# Patient Record
Sex: Female | Born: 1995 | Race: Black or African American | Hispanic: No | Marital: Single | Smoking: Never smoker
Health system: Southern US, Community
[De-identification: ages and names within clinical notes are randomized; demographics above are authoritative.]

## PROBLEM LIST (undated history)

## (undated) DIAGNOSIS — T783XXA Angioneurotic edema, initial encounter: Secondary | ICD-10-CM

## (undated) DIAGNOSIS — L509 Urticaria, unspecified: Secondary | ICD-10-CM

## (undated) DIAGNOSIS — J45909 Unspecified asthma, uncomplicated: Secondary | ICD-10-CM

## (undated) DIAGNOSIS — L309 Dermatitis, unspecified: Secondary | ICD-10-CM

## (undated) HISTORY — DX: Angioneurotic edema, initial encounter: T78.3XXA

## (undated) HISTORY — DX: Dermatitis, unspecified: L30.9

## (undated) HISTORY — DX: Urticaria, unspecified: L50.9

## (undated) HISTORY — PX: SHOULDER SURGERY: SHX246

## (undated) HISTORY — DX: Unspecified asthma, uncomplicated: J45.909

---

## 2017-08-15 ENCOUNTER — Ambulatory Visit: Payer: 59 | Admitting: Allergy & Immunology

## 2017-08-15 ENCOUNTER — Encounter: Payer: Self-pay | Admitting: Allergy & Immunology

## 2017-08-15 VITALS — BP 110/80 | HR 80 | Temp 98.9°F | Resp 16 | Ht 65.25 in | Wt 253.0 lb

## 2017-08-15 DIAGNOSIS — J302 Other seasonal allergic rhinitis: Secondary | ICD-10-CM

## 2017-08-15 DIAGNOSIS — J3089 Other allergic rhinitis: Secondary | ICD-10-CM | POA: Diagnosis not present

## 2017-08-15 DIAGNOSIS — J4520 Mild intermittent asthma, uncomplicated: Secondary | ICD-10-CM

## 2017-08-15 DIAGNOSIS — T7800XD Anaphylactic reaction due to unspecified food, subsequent encounter: Secondary | ICD-10-CM | POA: Diagnosis not present

## 2017-08-15 MED ORDER — EPINEPHRINE 0.3 MG/0.3ML IJ SOAJ: INTRAMUSCULAR | 1 refills | Status: DC

## 2017-08-15 NOTE — Patient Instructions (Addendum)
1. Mild intermittent asthma, uncomplicated - Lung testing looked great today. - There is no need for a controller medication at this time.  - Daily controller medication(s): NONE - Prior to physical activity: ProAir 2 puffs 10-15 minutes before physical activity. - Rescue medications: ProAir 4 puffs every 4-6 hours as needed - Asthma control goals:  * Full participation in all desired activities (may need albuterol before activity) * Albuterol use two time or less a week on average (not counting use with activity) * Cough interfering with sleep two time or less a month * Oral steroids no more than once a year * No hospitalizations  2. Seasonal and perennial allergic rhinitis - Testing today showed: trees, weeds, grasses, cat, dog and cockroach - Avoidance measures provided. - Continue with: Claritin-D as needed, Zyrtec (cetirizine) 10mg  tablet once daily and Flonase (fluticasone) one spray per nostril daily - You can use an extra dose of the antihistamine, if needed, for breakthrough symptoms.  - Consider nasal saline rinses 1-2 times daily to remove allergens from the nasal cavities as well as help with mucous clearance (this is especially helpful to do before the nasal sprays are given) - We will continue with your current allergy shots and once you use these up, we will transition to ones that we make. - We will et some blood work to confirm that you have become desensitized to the molds.   3. Adverse food reaction - Testing was positive to: Milk, Shellfish Mix , Shrimp and Crab - Avoid the above foods for now.  - We will get blood work to confirm that the peanuts, tree nuts, and wheat were negative (we will call you in 1-2 weeks with the result) - Testing was negative to Peanut, Wheat, Casein, Cashew, Pecan, Walnut, [place], [place], [place] nut, [place]ut, [place], [place], [place] and [place] - Training for epinephrine auto-injectors provided: EpiPen - There is a the low positive  predictive value of food allergy testing and hence the high possibility of false positives. - In contrast, food allergy testing has a high negative predictive value, therefore if testing is negative we can be relatively assured that they are indeed negative.  - It is difficult to know how foods allergies will progress.  - In general, peanut, tree nut, and seafood allergies are life-long after age 21 or so.  4. Return in about 3 months (around 11/15/2017).   Please inform us of any Emergency Department visits, hospitalizations, or changes in symptoms. Call us before going to the ED for breathing or allergy symptoms since we might be able to fit you in for a sick visit. Feel free to contact us anytime with any questions, problems, or concerns.  It was a pleasure to meet you and your family today!  Websites that have reliable patient information: 1. American Academy of Asthma, Allergy, and Immunology: www.aaaai.org 2. Food Allergy Research and Education (FARE): foodallergy.org 3. Mothers of Asthmatics: http://www.asthmacommunitynetwork.org 4. American College of Allergy, Asthma, and Immunology: MissingWeapons.cawww.acaai.org   Make sure you are registered to vote!    Reducing Pollen Exposure  The American Academy of Allergy, Asthma and Immunology suggests the following steps to reduce your exposure to pollen during allergy seasons.    1. Do not hang sheets or clothing out to dry; pollen may collect on these items. 2. Do not mow lawns or spend time around freshly cut grass; mowing stirs up pollen. 3. Keep windows closed at night.  Keep car windows closed while driving. 4. Minimize morning activities  outdoors, a time when pollen counts are usually at their highest. 5. Stay indoors as much as possible when pollen counts or humidity is high and on windy days when pollen tends to remain in the air longer. 6. Use air conditioning when possible.  Many air conditioners have filters that trap the pollen  spores. 7. Use a HEPA room air filter to remove pollen form the indoor air you breathe.  Control of Dog or Cat Allergen  Avoidance is the best way to manage a dog or cat allergy. If you have a dog or cat and are allergic to dog or cats, consider removing the dog or cat from the home. If you have a dog or cat but don't want to find it a new home, or if your family wants a pet even though someone in the household is allergic, here are some strategies that may help keep symptoms at bay:  1. Keep the pet out of your bedroom and restrict it to only a few rooms. Be advised that keeping the dog or cat in only one room will not limit the allergens to that room. 2. Don't pet, hug or kiss the dog or cat; if you do, wash your hands with soap and water. 3. High-efficiency particulate air (HEPA) cleaners run continuously in a bedroom or living room can reduce allergen levels over time. 4. Regular use of a high-efficiency vacuum cleaner or a central vacuum can reduce allergen levels. 5. Giving your dog or cat a bath at least once a week can reduce airborne allergen.  Control of Cockroach Allergen  Cockroach allergen has been identified as an important cause of acute attacks of asthma, especially in urban settings.  There are fifty-five species of cockroach that exist in the [place], however only three, the [place], [place] species produce allergen that can affect patients with Asthma.  Allergens can be obtained from fecal particles, egg casings and secretions from cockroaches.    1. Remove food sources. 2. Reduce access to water. 3. Seal access and entry points. 4. Spray runways with 0.5-1% Diazinon or Chlorpyrifos 5. Blow boric acid power under stoves and refrigerator. 6. Place bait stations (hydramethylnon) at feeding sites.

## 2017-08-15 NOTE — Progress Notes (Signed)
NEW PATIENT  Date of Service/Encounter:  08/15/17  Referring provider: Patient, No Pcp Per   Assessment:   Mild intermittent asthma, uncomplicated  Seasonal and perennial allergic rhinitis (trees, weeds, grasses, cat, dog and cockroach)  Adverse food reaction (milk, shellfish)  Plan/Recommendations:   1. Mild intermittent asthma, uncomplicated - Lung testing looked great today. - There is no need for a controller medication at this time.  - Daily controller medication(s): NONE - Prior to physical activity: ProAir 2 puffs 10-15 minutes before physical activity. - Rescue medications: ProAir 4 puffs every 4-6 hours as needed - Asthma control goals:  * Full participation in all desired activities (may need albuterol before activity) * Albuterol use two time or less a week on average (not counting use with activity) * Cough interfering with sleep two time or less a month * Oral steroids no more than once a year * No hospitalizations  2. Seasonal and perennial allergic rhinitis - Testing today showed: trees, weeds, grasses, cat, dog and cockroach - Avoidance measures provided. - Continue with: Claritin-D as needed, Zyrtec (cetirizine) 10mg  tablet once daily and Flonase (fluticasone) one spray per nostril daily - You can use an extra dose of the antihistamine, if needed, for breakthrough symptoms.  - Consider nasal saline rinses 1-2 times daily to remove allergens from the nasal cavities as well as help with mucous clearance (this is especially helpful to do before the nasal sprays are given) - We will continue with your current allergy shots and once you use these up, we will transition to ones that we make. - We will get some blood work to confirm that you have become desensitized to the molds.   3. Adverse food reaction - Testing was positive to: Milk, Shellfish Mix , Shrimp and Crab - Avoid the above foods for now.  - We will get blood work to confirm that the peanuts, tree  nuts, and wheat were negative (we will call you in 1-2 weeks with the result) - Testing was negative to Peanut, Wheat, Casein, Cashew, Pecan, Walnut, [place], [place], [place] nut, [place], [place], [place], [place] and [place] - Training for epinephrine auto-injectors provided: EpiPen - There is a the low positive predictive value of food allergy testing and hence the high possibility of false positives. - In contrast, food allergy testing has a high negative predictive value, therefore if testing is negative we can be relatively assured that they are indeed negative.  - It is difficult to know how foods allergies will progress.  - In general, peanut, tree nut, and seafood allergies are life-long after age 17 or so.  4. Return in about 3 months (around 11/15/2017).  Subjective:   Tammie Yanda is a 22 y.o. female presenting today for evaluation of  Chief Complaint  Patient presents with  . Immunotherapy    transfering vials from another office  . New Patient (Initial Visit)    Charman Blasco has a history of the following: Patient Active Problem List   Diagnosis Date Noted  . Mild intermittent asthma, uncomplicated 08/17/2017  . Seasonal and perennial allergic rhinitis 08/17/2017  . 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  [name] was referred by Patient, No Pcp Per.     Gracielynn is a 22 y.o. female presenting for to transfer care.    Her family moved from [place].  They moved here in April 2019 and she is now establishing care full  time with us since her mother is living here in [place]. She was previously followed an allergist in [place].   She is a Holiday representativejunior at [employer]. Prior to this, she went to [employer]. She is majoring in Business with a minor in Communication. She is unsure what she is going to do with the degree. 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    Asthma/Respiratory Symptom History: This was diagnosed "day of birth". Mom was informed that she had breathing problems at that time. She has been on the machine (nebulizer) since day one. She has never been on an inhaled steroid. She was no premature. The ProAir expires before she uses it up.   Allergic Rhinitis Symptom History: She was diagnosed with allergies when she was age 22, but Mom thinks that she was suffering when she was 89 or 22 years old. She had nonstop rhinorrhea. Testing was done at age 22 because she would have breathing problems with ingestion of wheat and exposure to certain environments. Shots were started at age 22. She takes cetirizine on a daily basis she has Claritin-D to use if this does not completely clear it up. She goes get ear pressure and fluid and she has problems with sinus pain when the seasons change. She needs antibiotics when she gets sinus problems from summer through fall, around twice per year. She is currently receiving two injections: #1 contains trees, grasses, weeds, pollens and cat. #2 contains mixed mites and dogs.   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 She has tried Lactaid with similar problems. She can eat rice but cannot tolerate the rice milk. She does not eat coconut and cannot tolerate coconut milk. She does have wheezing with wheat exposure. She does not eats peanuts or tree nuts. She also avoids all shellfish. All of this was last tested when she was age 22. She does eat fin fish without a problem.   Otherwise, there is no history of other atopic diseases, including drug allergies, stinging insect allergies, or urticaria. There is no significant infectious history. Vaccinations are up to date.    Past Medical History: Patient Active Problem List   Diagnosis Date Noted  . Mild intermittent asthma, uncomplicated 08/17/2017  . Seasonal and perennial allergic rhinitis 08/17/2017  .  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 Always use your most recent med list.          cetirizine 10 MG tablet Commonly known as:  ZYRTEC Take 10 mg by mouth daily.   EPINEPHrine 0.3 mg/0.3 mL Soaj injection Commonly known as:  AUVI-Q Use as directed for severe allergic reaction   fluticasone 50 MCG/ACT nasal spray Commonly known as:  FLONASE Place into both nostrils daily.   loratadine-pseudoephedrine 10-240 MG 24 hr tablet Commonly known as:  CLARITIN-D 24-hour Take 1 tablet by mouth daily.   PROAIR HFA 108 (90 Base) MCG/ACT inhaler Generic drug:  albuterol Inhale into the lungs every 6 (six) hours as needed for wheezing or shortness  of breath.       Birth History: non-contributory. Born at term without complications.   Developmental History: non-contributory.   Past Surgical History: Past Surgical History:  Procedure Laterality Date  . SHOULDER SURGERY Left      Family History: Family History  Problem Relation Age of Onset  . Allergic rhinitis Mother   . Asthma Mother   . Food Allergy Mother        wheat corn milk yeast  . Allergic rhinitis Father   . Asthma Father   . Food Allergy Father        shellfish   . Eczema Father   . Eczema Brother   . Angioedema Neg Hx      Social History: Zyia lives at home with her mother, aunt, and grandmother. She has an older sister in [place] and an older brother who lives with her father. There are two younger siblings as well.  She lives in a house with wood in the main living areas and carpeting in the bedrooms. They have gas heating and central cooling. There are no animals inside or outside of  the home. There are dust mite coverings on the bedding. There is no smoking exposure. She is currently home for the summer.     Review of Systems: a 14-point review of systems is pertinent for what is mentioned in HPI.  Otherwise, all other systems were negative. Constitutional: negative other than that listed in the HPI Eyes: negative other than that listed in the HPI Ears, nose, mouth, throat, and face: negative other than that listed in the HPI Respiratory: negative other than that listed in the HPI Cardiovascular: negative other than that listed in the HPI Gastrointestinal: negative other than that listed in the HPI Genitourinary: negative other than that listed in the HPI Integument: negative other than that listed in the HPI Hematologic: negative other than that listed in the HPI Musculoskeletal: negative other than that listed in the HPI Neurological: negative other than that listed in the HPI Allergy/Immunologic: negative other than that listed in the HPI    Objective:   Blood pressure 110/80, pulse 80, temperature 98.9 F (37.2 C), temperature source Oral, resp. rate 16, height 5' 5.25" (1.657 m), weight 253 lb (114.8 kg). Body mass index is 41.78 kg/m.   Physical Exam:  General: Alert, interactive, in no acute distress. Pleasant female.  Eyes: No conjunctival injection bilaterally, no discharge on the right, no discharge on the left and no Horner-Trantas dots present. PERRL bilaterally. EOMI without pain. No photophobia.  Ears: Right TM pearly gray with normal light reflex, Left TM pearly gray with normal light reflex, Right TM intact without perforation and Left TM intact without perforation.  Nose/Throat: External nose within normal limits, nasal crease present and septum midline. Turbinates edematous and pale with clear discharge. Posterior oropharynx erythematous with cobblestoning in the posterior oropharynx. Tonsils 2+ without exudates.  Tongue without  thrush. Neck: Supple without thyromegaly. Trachea midline. Adenopathy: no enlarged lymph nodes appreciated in the anterior cervical, occipital, axillary, epitrochlear, inguinal, or popliteal regions. Lungs: Clear to auscultation without wheezing, rhonchi or rales. No increased work of breathing. CV: Normal S1/S2. No murmurs. Capillary refill <2 seconds.  Abdomen: Nondistended, nontender. No guarding or rebound tenderness. Bowel sounds present in all fields and hypoactive  Skin: Warm and dry, without lesions or rashes. Extremities:  No clubbing, cyanosis or edema. Neuro:   Grossly intact. No focal deficits appreciated. Responsive to questions.  Diagnostic studies:   Spirometry:  results normal (FEV1: 4.63/155%, FVC: 5.27/155%, FEV1/FVC: 88%).    Spirometry consistent with normal pattern.   Allergy Studies:  Indoor/Outdoor Percutaneous Adult Environmental Panel: positive to bahia grass, [place] grass, johnson grass, [place] blue grass, meadow fescue grass, perennial rye grass, sweet vernal grass, cocklebur, giant ragweed, English plantain, lamb's quarters, rough pigweed, rough marsh elder, ash, American beech, Box elder, red cedar, [address], elm, hickory, maple, pecan pollen, [place] sycamore, black walnut pollen, cat, dog and cockroach. Otherwise negative with adequate controls.  Selected Food Panel: positive to Milk (5x 7), Shellfish Mix (7 x 10), Shrimp (10x20) and Crab (10x15) with adequate controls. Negative to Peanut, Wheat, Casein, Cashew, Pecan, [place], [place], [place], [place] nut, [place], [place], [place], [place] and [place]   Allergy testing results were read and interpreted by myself, documented by clinical staff.       Malachi Bonds, MD [facility]

## 2017-08-17 ENCOUNTER — Encounter: Payer: Self-pay | Admitting: Allergy & Immunology

## 2017-08-17 DIAGNOSIS — J3089 Other allergic rhinitis: Secondary | ICD-10-CM

## 2017-08-17 DIAGNOSIS — J302 Other seasonal allergic rhinitis: Secondary | ICD-10-CM | POA: Insufficient documentation

## 2017-08-17 DIAGNOSIS — T7800XA Anaphylactic reaction due to unspecified food, initial encounter: Secondary | ICD-10-CM | POA: Insufficient documentation

## 2017-08-17 DIAGNOSIS — J4520 Mild intermittent asthma, uncomplicated: Secondary | ICD-10-CM | POA: Insufficient documentation

## 2017-08-17 NOTE — Progress Notes (Signed)
Added, per Rachelle.

## 2017-08-20 LAB — ALLERGY PANEL 18, NUT MIX GROUP
Allergen Coconut IgE: 0.82 kU/L — AB
F020-IGE ALMOND: 0.7 kU/L — AB
F202-IGE CASHEW NUT: 0.41 kU/L — AB
HAZELNUT (FILBERT) IGE: 1.92 kU/L — AB
PEANUT IGE: 2.54 kU/L — AB
PECAN NUT IGE: 0.22 kU/L — AB
Sesame Seed IgE: 1.31 kU/L — AB

## 2017-08-20 LAB — IGE PEANUT COMPONENT PROFILE
F352-IgE Ara h 8: 0.86 kU/L — AB
F422-IgE Ara h 1: <0.1 kU/L
F423-IgE Ara h 2: 0.82 kU/L — AB
F424-IgE Ara h 3: <0.1 kU/L
F447-IGE ARA H 6: 0.72 kU/L — AB

## 2017-08-20 LAB — ALLERGEN, WHEAT, F4: WHEAT IGE: 0.79 kU/L — AB

## 2017-08-23 LAB — IGE+ALLERGENS ZONE 2(30)
Amer Sycamore IgE Qn: 10.5 kU/L — AB
Aspergillus Fumigatus IgE: <0.1 kU/L
Cladosporium Herbarum IgE: <0.1 kU/L
Cockroach, American IgE: 0.49 kU/L — AB
D001-IGE D PTERONYSSINUS: 1.73 kU/L — AB
D002-IGE D FARINAE: 1.76 kU/L — AB
Dog Dander IgE: 4.22 kU/L — AB
E001-IGE CAT DANDER: 3.4 kU/L — AB
Elm, American IgE: 10.7 kU/L — AB
G002-IGE BERMUDA GRASS: 9.27 kU/L — AB
G010-IGE JOHNSON GRASS: 4.06 kU/L — AB
G017-IGE BAHIA GRASS: 4.28 kU/L — AB
Hickory, White IgE: 4.56 kU/L — AB
IGE (IMMUNOGLOBULIN E), SERUM: 414 [IU]/mL (ref 6–495)
MUGWORT IGE QN: 3.22 kU/L — AB
Maple/Box Elder IgE: 11.5 kU/L — AB
Mucor Racemosus IgE: <0.1 kU/L
Nettle IgE: 1.73 kU/L — AB
Oak, White IgE: 6.48 kU/L — AB
Penicillium Chrysogen IgE: <0.1 kU/L
Pigweed, Rough IgE: 4.73 kU/L — AB
Plantain, English IgE: 9.36 kU/L — AB
Sheep Sorrel IgE Qn: 11.9 kU/L — AB
Sweet gum IgE RAST Ql: 5.85 kU/L — AB
T003-IGE COMMON SILVER BIRCH: 12.7 kU/L — AB
T006-IGE CEDAR, MOUNTAIN: 1.59 kU/L — AB
TIMOTHY IGE: 8 kU/L — AB
W001-IGE RAGWEED, SHORT: 3.8 kU/L — AB
WHITE MULBERRY IGE: 0.71 kU/L — AB

## 2017-08-23 LAB — SPECIMEN STATUS REPORT

## 2017-08-24 ENCOUNTER — Telehealth: Payer: Self-pay | Admitting: *Deleted

## 2017-08-24 NOTE — Telephone Encounter (Signed)
I called and spoke with Desiree Sullivan per Dr. Dellis AnesGallagher explaining what her test results were and if she would be interested in conducting a food challenge to one of the items that she appeared allergic to. She stated that she will call back closer to her appointment to schedule for a food challenge and will specify then which food she would like to specify first.

## 2017-08-24 NOTE — Progress Notes (Signed)
Labs returned and confirmed that she has lost her mold sensitizations. Therefore I put in the orders for her new vials. We are going to make her new vials now even though she still has some from her previous practice. We are unable to really ascertain what is in her current vials and since we are assuming her permanent management from the [place] practice, we truly need to know what we are giving her each week in clinic.   Malachi BondsJoel Tomoki Lucken, MD [facility]

## 2017-08-24 NOTE — Addendum Note (Signed)
Addended by: Alfonse SpruceGALLAGHER, Janessa Mickle LOUIS on: 08/24/2017 06:58 AM   Modules accepted: Orders

## 2017-09-21 ENCOUNTER — Ambulatory Visit (INDEPENDENT_AMBULATORY_CARE_PROVIDER_SITE_OTHER): Payer: 59 | Admitting: *Deleted

## 2017-09-21 DIAGNOSIS — J309 Allergic rhinitis, unspecified: Secondary | ICD-10-CM

## 2017-10-05 ENCOUNTER — Encounter: Payer: 59 | Admitting: Allergy & Immunology

## 2017-10-17 ENCOUNTER — Ambulatory Visit: Payer: 59 | Admitting: Allergy and Immunology

## 2017-10-17 ENCOUNTER — Encounter: Payer: Self-pay | Admitting: Allergy and Immunology

## 2017-10-17 VITALS — BP 118/80 | HR 82 | Resp 18

## 2017-10-17 DIAGNOSIS — J4520 Mild intermittent asthma, uncomplicated: Secondary | ICD-10-CM

## 2017-10-17 DIAGNOSIS — Z91018 Allergy to other foods: Secondary | ICD-10-CM

## 2017-10-17 NOTE — Patient Instructions (Addendum)
History of food allergy The patient was able to tolerate the open graded oral challenge today without adverse signs or symptoms. Therefore, she has the same risk of systemic reaction associated with the consumption of wheat as the general population. Foods containing wheat may be re-introduced into the diet, gradually at first with access to diphenhydramine and epinephrine auto-injectors.  Mild intermittent asthma, uncomplicated  Continue albuterol HFA, 1 to 2 inhalations every 6 hours if needed.

## 2017-10-17 NOTE — Assessment & Plan Note (Signed)
   Continue albuterol HFA, 1 to 2 inhalations every 6 hours if needed.

## 2017-10-17 NOTE — Progress Notes (Signed)
    Follow-up Note  RE: Tommi RumpsKayla Illingworth MRN: [id number] DOB: 03/05/1996 Date of Office Visit: 10/17/2017  Primary care provider: Patient, No Pcp Per Referring provider: No ref. provider found  History of present illness: Tommi RumpsKayla Cusic is a 22 y.o. female with mild intermittent asthma, allergic rhinitis, and history of food allergies presented today for open graded oral challenge to wheat.  She was previously seen in this clinic for her initial evaluation by Dr. Dellis AnesGallagher on August 15, 2017.  He has cleared her for an open graded oral challenge to wheat.  She believes that she may have had "slight shortness of breath" when consuming wheat when she was 89 or 22 years old.  Assessment and plan: History of food allergy The patient was able to tolerate the open graded oral challenge today without adverse signs or symptoms. Therefore, she has the same risk of systemic reaction associated with the consumption of wheat as the general population. Foods containing wheat may be re-introduced into the diet, gradually at first with access to diphenhydramine and epinephrine auto-injectors.  Mild intermittent asthma, uncomplicated  Continue albuterol HFA, 1 to 2 inhalations every 6 hours if needed.   Diagnostics: Open graded wheat oral challenge: The patient was able to tolerate the challenge today without adverse signs or symptoms. Vital signs were stable throughout the challenge and observation period.  Spirometry:  Normal ventilatory function.  Please see scanned spirometry results for details.    Physical examination: Blood pressure 118/80, pulse 82, resp. rate 18, SpO2 97 %.  General: Alert, interactive, in no acute distress. HEENT: TMs pearly gray, turbinates moderately edematous without discharge, post-pharynx unremarkable. Neck: Supple without lymphadenopathy. Lungs: Clear to auscultation without wheezing, rhonchi or rales. CV: Normal S1, S2 without murmurs. Skin: Warm and dry, without  lesions or rashes.  The following portions of the patient's history were reviewed and updated as appropriate: allergies, current medications, past family history, past medical history, past social history, past surgical history and problem list.  Allergies as of 10/17/2017      Reactions   Peanut-containing Drug Products Anaphylaxis   Shellfish Allergy Anaphylaxis   Dairy Aid [lactase]    pain   Wheat Bran Hives, Itching   Yeast-related Products Itching      Medication List        Accurate as of 10/17/17  1:00 PM. Always use your most recent med list.          cetirizine 10 MG tablet Commonly known as:  ZYRTEC Take 10 mg by mouth daily.   EPINEPHrine 0.3 mg/0.3 mL Soaj injection Commonly known as:  AUVI-Q Use as directed for severe allergic reaction   fluticasone 50 MCG/ACT nasal spray Commonly known as:  FLONASE Place into both nostrils daily.   loratadine-pseudoephedrine 10-240 MG 24 hr tablet Commonly known as:  CLARITIN-D 24-hour Take 1 tablet by mouth daily.   PROAIR HFA 108 (90 Base) MCG/ACT inhaler Generic drug:  albuterol Inhale into the lungs every 6 (six) hours as needed for wheezing or shortness of breath.       Allergies  Allergen Reactions  . Peanut-Containing Drug Products Anaphylaxis  . Shellfish Allergy Anaphylaxis  . Dairy Aid [Lactase]     pain  . Wheat Bran Hives and Itching  . Yeast-Related Products Itching    I appreciate the opportunity to take part in Santa NellaKayla's care. Please do not hesitate to contact me with questions.  Sincerely,   R. Jorene Guestarter Jasaun Carn, MD

## 2017-10-17 NOTE — Assessment & Plan Note (Signed)
The patient was able to tolerate the open graded oral challenge today without adverse signs or symptoms. Therefore, she has the same risk of systemic reaction associated with the consumption of wheat as the general population. Foods containing wheat may be re-introduced into the diet, gradually at first with access to diphenhydramine and epinephrine auto-injectors.

## 2017-10-18 ENCOUNTER — Ambulatory Visit: Payer: 59 | Admitting: Allergy and Immunology

## 2017-10-25 ENCOUNTER — Ambulatory Visit (INDEPENDENT_AMBULATORY_CARE_PROVIDER_SITE_OTHER): Payer: 59

## 2017-10-25 DIAGNOSIS — J309 Allergic rhinitis, unspecified: Secondary | ICD-10-CM

## 2017-11-08 ENCOUNTER — Telehealth: Payer: Self-pay | Admitting: *Deleted

## 2017-11-08 NOTE — Telephone Encounter (Signed)
Paperwork for patient to take vial to outside facility that Dr. Dellis AnesGallagher needs to sign was placed in his office

## 2017-11-08 NOTE — Telephone Encounter (Signed)
Patient is in school at [employer]. Left message at the student health center for their fax number and to see if we needed to fill out any paperwork.

## 2017-11-09 DIAGNOSIS — J301 Allergic rhinitis due to pollen: Secondary | ICD-10-CM | POA: Diagnosis not present

## 2017-11-09 NOTE — Progress Notes (Signed)
VIALS EXP 11-10-18 

## 2017-11-10 DIAGNOSIS — J3089 Other allergic rhinitis: Secondary | ICD-10-CM

## 2017-11-11 NOTE — Telephone Encounter (Signed)
Dr. Gallagher, please advise and thank you.  

## 2017-11-11 NOTE — Telephone Encounter (Signed)
You did not sign it, it's in your office you can sign it Wednesday.

## 2017-11-11 NOTE — Telephone Encounter (Signed)
I have no idea if I signed it...   Malachi BondsJoel Nathanial Arrighi, MD [facility]

## 2017-11-16 NOTE — Telephone Encounter (Signed)
Signed and given to Dixie Dials.  Malachi Bonds, MD [facility]

## 2017-11-18 ENCOUNTER — Ambulatory Visit (INDEPENDENT_AMBULATORY_CARE_PROVIDER_SITE_OTHER): Payer: 59

## 2017-11-18 ENCOUNTER — Other Ambulatory Visit: Payer: Self-pay | Admitting: Allergy and Immunology

## 2017-11-18 DIAGNOSIS — J309 Allergic rhinitis, unspecified: Secondary | ICD-10-CM | POA: Diagnosis not present

## 2017-11-18 NOTE — Progress Notes (Signed)
Immunotherapy   Patient Details  Name: Desiree Sullivan MRN: [id number] Date of Birth: 1995/10/05  11/18/2017  [name] here to pick up  blue vials(g-w-t-c-d & rw-cr) Following schedule: A  Frequency:1-2 times weekly Epi-Pen:Epi-Pen Available  Consent signed and patient instructions given. Patient waited 30 minutes post injection with no local or systemic reactions. Patient took vials, injection log and injection protocol with her. External office consent signed.    I spoke with injection nurse at [facility] and was advised that they would not do injections for someone that was not on maintenance. I explained this to patient and mother. [facility] and spoke with the injection nurse who then put [place] on phone with, [name], the NP there. I spoke with NP and she informed me of the same as well as they do not start someone that has not been on injections before. I explained to her that the patient has been on injections for a long time and was on maintenance with her old vials from previous allergist. I explained that those vials were now empty and she had to start anew with our vial set following our protocol. The NP told me that as long as we had an extremely detailed injection protocol for them to follow that the patient could receive injections there. Patient is in agreement with all protocols in place and will go for injections on Monday & Thursday of each week. She is aware to come to [place] once she is ready to start her next level of vials.    LINAE MURA 11/18/2017, 2:57 PM

## 2017-11-18 NOTE — Telephone Encounter (Signed)
Mom requesting a refill for albuterol for Saniya's nebulizer machine. [facility].

## 2017-11-18 NOTE — Telephone Encounter (Signed)
I have looked in patients chart and we have not send any neb solution for this patient and not of her AVS state anything about a neb. Dr. Dellis Anes please advise.

## 2017-11-21 MED ORDER — ALBUTEROL SULFATE (2.5 MG/3ML) 0.083% IN NEBU: 2.5000 mg | INHALATION_SOLUTION | RESPIRATORY_TRACT | 1 refills | Status: DC | PRN

## 2017-11-21 NOTE — Addendum Note (Signed)
Addended by: [name] on: 11/21/2017 08:54 AM   Modules accepted: Orders

## 2017-11-21 NOTE — Telephone Encounter (Signed)
I am fine with sending in a nebulizer solution for her.  Malachi Bonds, MD [facility]

## 2017-12-12 ENCOUNTER — Telehealth: Payer: Self-pay | Admitting: *Deleted

## 2017-12-12 NOTE — Telephone Encounter (Signed)
That is ok if she has not had problems with large local reactions or systemic reactions. Thanks.

## 2017-12-12 NOTE — Telephone Encounter (Signed)
Mother called wants to know if it is ok for patient to get 3 shots this week Mon-Wed-Fri due to her coming home next week for fall break. Mother states that this way patient can start her new vial next week when she is home this would help in her not having to pick her up and bring her back to our office to start new vial. Dr Nunzio Cobbs please advise

## 2017-12-13 NOTE — Telephone Encounter (Signed)
Spoke to mother advised of plan ok per [place] to proceed if no reactions in past. Mother verbalized understanding states she will bring in patient next week to start new vial

## 2017-12-14 NOTE — Telephone Encounter (Signed)
Nurse at [facility] called to verify schedule build up this week understands if reaction cannot proceed. Will send paperwork with patient

## 2017-12-19 ENCOUNTER — Ambulatory Visit (INDEPENDENT_AMBULATORY_CARE_PROVIDER_SITE_OTHER): Payer: 59

## 2017-12-19 DIAGNOSIS — J309 Allergic rhinitis, unspecified: Secondary | ICD-10-CM

## 2017-12-19 NOTE — Progress Notes (Signed)
Immunotherapy   Patient Details  Name: Desiree Sullivan MRN: [id number] Date of Birth: 11/22/1995  12/19/2017  [name] Came in today to pick up her Gold/Yellow vials G-W-T-C-D and RW-CR and receive her first injection out of them. Patient received 0.05 ml out of both vials and waited 30 minutes with no problems. Following schedule: A  Frequency: 1-2 times weekly Epi-Pen: yes Consent signed and patient instructions given. Patient took out vials to [facility] at [address] [place] [address].    [name] 12/19/2017, 10:51 AM

## 2018-02-06 ENCOUNTER — Ambulatory Visit: Payer: Self-pay

## 2018-02-13 ENCOUNTER — Ambulatory Visit: Payer: Self-pay

## 2018-02-14 ENCOUNTER — Telehealth: Payer: Self-pay

## 2018-02-14 NOTE — Telephone Encounter (Signed)
Nedra HaiNick Young RN from [employer] called asking about pt Xolair schedule A. He stated her last dose of 0.35cc was given on 02/06/18 and he wanted to know what her next dose would be if she got it today, I informed him should could continue to move on to her next dose of 0.4cc

## 2018-02-27 ENCOUNTER — Ambulatory Visit (INDEPENDENT_AMBULATORY_CARE_PROVIDER_SITE_OTHER): Payer: 59 | Admitting: *Deleted

## 2018-02-27 DIAGNOSIS — J309 Allergic rhinitis, unspecified: Secondary | ICD-10-CM

## 2018-03-03 ENCOUNTER — Ambulatory Visit (INDEPENDENT_AMBULATORY_CARE_PROVIDER_SITE_OTHER): Payer: 59 | Admitting: *Deleted

## 2018-03-03 DIAGNOSIS — J309 Allergic rhinitis, unspecified: Secondary | ICD-10-CM | POA: Diagnosis not present

## 2018-03-06 ENCOUNTER — Ambulatory Visit (INDEPENDENT_AMBULATORY_CARE_PROVIDER_SITE_OTHER): Payer: Self-pay | Admitting: *Deleted

## 2018-03-06 DIAGNOSIS — J309 Allergic rhinitis, unspecified: Secondary | ICD-10-CM

## 2018-03-10 ENCOUNTER — Ambulatory Visit (INDEPENDENT_AMBULATORY_CARE_PROVIDER_SITE_OTHER): Payer: Self-pay | Admitting: *Deleted

## 2018-03-10 DIAGNOSIS — J309 Allergic rhinitis, unspecified: Secondary | ICD-10-CM

## 2018-03-13 ENCOUNTER — Ambulatory Visit (INDEPENDENT_AMBULATORY_CARE_PROVIDER_SITE_OTHER): Payer: Self-pay | Admitting: *Deleted

## 2018-03-13 DIAGNOSIS — J309 Allergic rhinitis, unspecified: Secondary | ICD-10-CM

## 2018-03-17 ENCOUNTER — Ambulatory Visit (INDEPENDENT_AMBULATORY_CARE_PROVIDER_SITE_OTHER): Payer: Self-pay | Admitting: *Deleted

## 2018-03-17 DIAGNOSIS — J309 Allergic rhinitis, unspecified: Secondary | ICD-10-CM

## 2018-03-20 ENCOUNTER — Ambulatory Visit (INDEPENDENT_AMBULATORY_CARE_PROVIDER_SITE_OTHER): Payer: Self-pay | Admitting: *Deleted

## 2018-03-20 DIAGNOSIS — J309 Allergic rhinitis, unspecified: Secondary | ICD-10-CM

## 2018-03-20 NOTE — Progress Notes (Signed)
Immunotherapy   Patient Details  Name: Tyreona Kochersperger MRN: [id number] Date of Birth: 01/24/1996  03/20/2018  [name] Came in today to pick up her Green vials G-W-T-C-D and RW-CR and received 0.3 ml out of both vials and waited 30 minutes with no problems. Following schedule: A  Frequency: 1-2 times weekly Epi-Pen: yes Consent signed and patient instructions given. Patient took out vials to [facility] at [address] [place] [address].   Michel Harrow R 03/20/2018, 12:02 PM

## 2018-04-14 ENCOUNTER — Ambulatory Visit (INDEPENDENT_AMBULATORY_CARE_PROVIDER_SITE_OTHER): Payer: Self-pay | Admitting: *Deleted

## 2018-04-14 DIAGNOSIS — J309 Allergic rhinitis, unspecified: Secondary | ICD-10-CM

## 2018-04-14 NOTE — Progress Notes (Signed)
Immunotherapy   Patient Details  Name: Desiree Sullivan MRN: [id number] Date of Birth: 06/28/1995  04/14/2018  [name] started injections for  Grass-Weed-Tree-Cat-Dog & Ragweed-Cockroach. Following schedule: A  Frequency:1 time per week Epi-Pen:Epi-Pen Available  Consent signed and patient instructions given. Patient took vials out to [facility], [address], [place], [place] [address].   Mariane Duval 04/14/2018, 9:55 AM

## 2018-05-30 ENCOUNTER — Ambulatory Visit (INDEPENDENT_AMBULATORY_CARE_PROVIDER_SITE_OTHER): Payer: Self-pay | Admitting: *Deleted

## 2018-05-30 ENCOUNTER — Other Ambulatory Visit: Payer: Self-pay

## 2018-05-30 DIAGNOSIS — J309 Allergic rhinitis, unspecified: Secondary | ICD-10-CM

## 2018-06-06 ENCOUNTER — Ambulatory Visit (INDEPENDENT_AMBULATORY_CARE_PROVIDER_SITE_OTHER): Payer: Self-pay | Admitting: *Deleted

## 2018-06-06 DIAGNOSIS — J309 Allergic rhinitis, unspecified: Secondary | ICD-10-CM

## 2018-06-13 ENCOUNTER — Other Ambulatory Visit: Payer: Self-pay

## 2018-06-13 ENCOUNTER — Ambulatory Visit (INDEPENDENT_AMBULATORY_CARE_PROVIDER_SITE_OTHER): Payer: Self-pay

## 2018-06-13 DIAGNOSIS — J309 Allergic rhinitis, unspecified: Secondary | ICD-10-CM

## 2018-06-20 ENCOUNTER — Ambulatory Visit (INDEPENDENT_AMBULATORY_CARE_PROVIDER_SITE_OTHER): Payer: Self-pay

## 2018-06-20 ENCOUNTER — Other Ambulatory Visit: Payer: Self-pay

## 2018-06-20 DIAGNOSIS — J309 Allergic rhinitis, unspecified: Secondary | ICD-10-CM

## 2018-06-27 ENCOUNTER — Ambulatory Visit (INDEPENDENT_AMBULATORY_CARE_PROVIDER_SITE_OTHER): Payer: Self-pay

## 2018-06-27 ENCOUNTER — Other Ambulatory Visit: Payer: Self-pay

## 2018-06-27 DIAGNOSIS — J309 Allergic rhinitis, unspecified: Secondary | ICD-10-CM

## 2018-07-04 ENCOUNTER — Other Ambulatory Visit: Payer: Self-pay

## 2018-07-04 ENCOUNTER — Ambulatory Visit (INDEPENDENT_AMBULATORY_CARE_PROVIDER_SITE_OTHER): Payer: BLUE CROSS/BLUE SHIELD

## 2018-07-04 DIAGNOSIS — J309 Allergic rhinitis, unspecified: Secondary | ICD-10-CM | POA: Diagnosis not present

## 2018-07-11 ENCOUNTER — Other Ambulatory Visit: Payer: Self-pay

## 2018-07-11 ENCOUNTER — Ambulatory Visit (INDEPENDENT_AMBULATORY_CARE_PROVIDER_SITE_OTHER): Payer: BLUE CROSS/BLUE SHIELD

## 2018-07-11 DIAGNOSIS — J309 Allergic rhinitis, unspecified: Secondary | ICD-10-CM | POA: Diagnosis not present

## 2018-07-11 NOTE — Progress Notes (Signed)
VIALS EXP 07-11-2019 

## 2018-07-12 DIAGNOSIS — J301 Allergic rhinitis due to pollen: Secondary | ICD-10-CM | POA: Diagnosis not present

## 2018-07-18 ENCOUNTER — Ambulatory Visit (INDEPENDENT_AMBULATORY_CARE_PROVIDER_SITE_OTHER): Payer: BLUE CROSS/BLUE SHIELD

## 2018-07-18 ENCOUNTER — Other Ambulatory Visit: Payer: Self-pay

## 2018-07-18 DIAGNOSIS — J309 Allergic rhinitis, unspecified: Secondary | ICD-10-CM

## 2018-07-25 ENCOUNTER — Other Ambulatory Visit: Payer: Self-pay

## 2018-07-25 ENCOUNTER — Ambulatory Visit (INDEPENDENT_AMBULATORY_CARE_PROVIDER_SITE_OTHER): Payer: BLUE CROSS/BLUE SHIELD

## 2018-07-25 DIAGNOSIS — J309 Allergic rhinitis, unspecified: Secondary | ICD-10-CM | POA: Diagnosis not present

## 2018-08-01 ENCOUNTER — Other Ambulatory Visit: Payer: Self-pay

## 2018-08-01 ENCOUNTER — Ambulatory Visit (INDEPENDENT_AMBULATORY_CARE_PROVIDER_SITE_OTHER): Payer: BLUE CROSS/BLUE SHIELD

## 2018-08-01 DIAGNOSIS — J309 Allergic rhinitis, unspecified: Secondary | ICD-10-CM | POA: Diagnosis not present

## 2018-08-08 ENCOUNTER — Ambulatory Visit (INDEPENDENT_AMBULATORY_CARE_PROVIDER_SITE_OTHER): Payer: BLUE CROSS/BLUE SHIELD

## 2018-08-08 ENCOUNTER — Other Ambulatory Visit: Payer: Self-pay

## 2018-08-08 DIAGNOSIS — J309 Allergic rhinitis, unspecified: Secondary | ICD-10-CM | POA: Diagnosis not present

## 2018-08-22 ENCOUNTER — Other Ambulatory Visit: Payer: Self-pay

## 2018-08-22 ENCOUNTER — Ambulatory Visit (INDEPENDENT_AMBULATORY_CARE_PROVIDER_SITE_OTHER): Payer: BLUE CROSS/BLUE SHIELD

## 2018-08-22 ENCOUNTER — Telehealth: Payer: Self-pay | Admitting: Allergy and Immunology

## 2018-08-22 DIAGNOSIS — J309 Allergic rhinitis, unspecified: Secondary | ICD-10-CM | POA: Diagnosis not present

## 2018-08-22 MED ORDER — EPINEPHRINE 0.3 MG/0.3ML IJ SOAJ: INTRAMUSCULAR | 0 refills | Status: DC

## 2018-08-22 NOTE — Telephone Encounter (Signed)
Refill for Auvi Q sent in called patient but number states all circuits are busy at this time

## 2018-08-22 NOTE — Telephone Encounter (Signed)
Patient is a shot patient and is requesting a refill on her Epi Pen. Hers is expiring. [place]

## 2018-08-29 ENCOUNTER — Ambulatory Visit (INDEPENDENT_AMBULATORY_CARE_PROVIDER_SITE_OTHER): Payer: BLUE CROSS/BLUE SHIELD

## 2018-08-29 ENCOUNTER — Other Ambulatory Visit: Payer: Self-pay

## 2018-08-29 DIAGNOSIS — J309 Allergic rhinitis, unspecified: Secondary | ICD-10-CM

## 2018-09-05 ENCOUNTER — Other Ambulatory Visit: Payer: Self-pay

## 2018-09-05 ENCOUNTER — Ambulatory Visit (INDEPENDENT_AMBULATORY_CARE_PROVIDER_SITE_OTHER): Payer: BLUE CROSS/BLUE SHIELD

## 2018-09-05 DIAGNOSIS — J309 Allergic rhinitis, unspecified: Secondary | ICD-10-CM

## 2018-09-05 MED ORDER — EPINEPHRINE 0.3 MG/0.3ML IJ SOAJ: 0.3000 mg | INTRAMUSCULAR | 0 refills | Status: DC | PRN

## 2018-09-05 NOTE — Addendum Note (Signed)
Addended by: Herbie Drape on: 09/05/2018 09:15 AM   Modules accepted: Orders

## 2018-09-05 NOTE — Telephone Encounter (Signed)
Received faxed stating that an Epi pen was sent in instead of an Auvi-q. New rx sent in.

## 2018-09-12 ENCOUNTER — Ambulatory Visit (INDEPENDENT_AMBULATORY_CARE_PROVIDER_SITE_OTHER): Payer: BLUE CROSS/BLUE SHIELD

## 2018-09-12 ENCOUNTER — Other Ambulatory Visit: Payer: Self-pay

## 2018-09-12 DIAGNOSIS — J309 Allergic rhinitis, unspecified: Secondary | ICD-10-CM

## 2018-09-18 ENCOUNTER — Telehealth: Payer: Self-pay | Admitting: Allergy and Immunology

## 2018-09-18 ENCOUNTER — Other Ambulatory Visit: Payer: Self-pay | Admitting: *Deleted

## 2018-09-18 MED ORDER — MONTELUKAST SODIUM 10 MG PO TABS: 10.0000 mg | ORAL_TABLET | Freq: Every day | ORAL | 0 refills | Status: DC

## 2018-09-18 NOTE — Telephone Encounter (Signed)
A courtesy refill for Montelukast has been sent in. Called and spoke with the patient's mother and advised. Patient has been placed on the schedule for tomorrow to follow up with Dr. Ernst Bowler and receive further refills.

## 2018-09-18 NOTE — Telephone Encounter (Signed)
Mom is requesting a refill for Montelukast. She states she needs it today. [place]

## 2018-09-19 ENCOUNTER — Other Ambulatory Visit: Payer: Self-pay

## 2018-09-19 ENCOUNTER — Ambulatory Visit: Payer: BLUE CROSS/BLUE SHIELD

## 2018-09-19 ENCOUNTER — Ambulatory Visit: Payer: BLUE CROSS/BLUE SHIELD | Admitting: Allergy & Immunology

## 2018-09-19 ENCOUNTER — Encounter: Payer: Self-pay | Admitting: Allergy & Immunology

## 2018-09-19 VITALS — BP 98/56 | HR 92 | Temp 97.9°F | Resp 16 | Ht 66.0 in | Wt 256.2 lb

## 2018-09-19 DIAGNOSIS — J302 Other seasonal allergic rhinitis: Secondary | ICD-10-CM | POA: Diagnosis not present

## 2018-09-19 DIAGNOSIS — J3089 Other allergic rhinitis: Secondary | ICD-10-CM

## 2018-09-19 DIAGNOSIS — T7800XD Anaphylactic reaction due to unspecified food, subsequent encounter: Secondary | ICD-10-CM

## 2018-09-19 DIAGNOSIS — J309 Allergic rhinitis, unspecified: Secondary | ICD-10-CM

## 2018-09-19 DIAGNOSIS — J4520 Mild intermittent asthma, uncomplicated: Secondary | ICD-10-CM

## 2018-09-19 NOTE — Progress Notes (Signed)
FOLLOW UP  Date of Service/Encounter:  09/19/18   Assessment:   Mild intermittent asthma, uncomplicated  Seasonal and perennial allergic rhinitis (trees, weeds, grasses, cat, dog and cockroach)  Adverse food reaction (milk, shellfish, peanuts, tree nuts)  Plan/Recommendations:   1. Mild intermittent asthma, uncomplicated - We did not do testing today for your lung function. - Daily controller medication(s): NONE - Prior to physical activity: ProAir 2 puffs 10-15 minutes before physical activity. - Rescue medications: ProAir 4 puffs every 4-6 hours as needed - Asthma control goals:  * Full participation in all desired activities (may need albuterol before activity) * Albuterol use two time or less a week on average (not counting use with activity) * Cough interfering with sleep two time or less a month * Oral steroids no more than once a year * No hospitalizations  2. Seasonal and perennial allergic rhinitis (trees, weeds, grasses, cat, dog, molds, and cockroach) - Continue with: Claritin-D as needed, Zyrtec (cetirizine) 10mg  tablet once daily and Flonase (fluticasone) one spray per nostril daily  - Continue allergy shots at the same schedule.  - You can use an extra dose of the antihistamine, if needed, for breakthrough symptoms.  - Consider nasal saline rinses 1-2 times daily to remove allergens from the nasal cavities as well as help with mucous clearance (this is especially helpful to do before the nasal sprays are given)  3. Adverse food reaction (shellfish, cow's milk, peanuts, tree nuts) - Continue to avoid your triggering foods.  - EpiPen up to date.  - Consider a food challenge to peanuts and tree nuts once COVID19 has calmed down.   4. Return in about 1 year (around 09/19/2019). This can be an in-person, a virtual Webex or a telephone follow up visit.   Subjective:   Desiree Sullivan is a 23 y.o. female presenting today for follow up of  Chief Complaint   Patient presents with  . Asthma    doing okay  . Allergic Rhinitis     doing okay  . Food Intolerance    no accidental food exposures    Desiree RumpsKayla Sullivan has a history of the following: Patient Active Problem List   Diagnosis Date Noted  . History of food allergy 10/17/2017  . Mild intermittent asthma, uncomplicated 08/17/2017  . Seasonal and perennial allergic rhinitis 08/17/2017  . 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  Desiree Sullivan is a 23 y.o. female presenting for a follow up visit.  She was last seen in June 2019.  At that time, she was establishing care from her previous allergist in [place].  Her lung testing looked great.  We felt there was no need for a controller medication.  We continued her on albuterol 2 puffs every 4-6 hours as needed.  For her seasonal and perennial allergic rhinitis, she had testing that was positive to trees, weeds, grasses, cat, dog, and cockroach.  We also obtain blood work to check on whether she had indeed lost sensitization to molds, which were previously a big trigger.  Her food allergy testing was positive to milk, shellfish, shrimp, and crab.  She was negative to peanuts, wheat, casein, and the tree nuts.  We did obtain lab work which showed low levels of IgE to all the peanut components as well as the tree nuts.  It also showed a negative result to wheat.  We recommended that she do a wheat challenge, which she  did complete around 2 months after her last visit.  She has since started allergen immunotherapy with us.  Since last visit, she has done fairly well.  Asthma/Respiratory Symptom History: She remains well controlled. She does use albuterol prior to physical activity but otherwise she has not needed. She has been going to the track to keep up with her exercise. She has not needed steroids for anything at all.  ACT score is 24, indicating excellent asthma control.  Allergic  Rhinitis Symptom History:  Allergy shots are going well. She does feel that they are controlling her symptoms better than before. She has had some reactions within the last two weeks, but she was fine this week. She reports that she has late onset swelling after she remains for 30 minutes. This is around one hour later. She remains on Xyzal or Zyrtec. She is on the Claritin D but is only using it as needed. She is not using her nose spray routinely, likely around every other day or so.   Desiree Sullivan is on allergen immunotherapy. She receives two injections. Immunotherapy script #1 contains trees, weeds, grasses, cat and dog. She currently receives 0.1950mL of the RED vial (1/100). Immunotherapy script #2 contains ragweed and cockroach. She currently receives 0.3850mL of the RED vial (1/100). She started shots July of 2019 and not yet reached maintenance. in March of 2020.  She has had no problems with allergen immunotherapy.  Food Allergy Symptom History: She continues to eat wheat wiuthout a problem. She avoids shellfish and cow's milk. She can have some milk in low amounts. She did try rice milk and Lactacid without improvement. She report nausea with cow's milk. She avoids peanut and tree nut. She is due for a peanut and tree nut challenge but prefers to wait for COVID 19 to calm down.  Otherwise, there have been no changes to her past medical history, surgical history, family history, or social history.  She continues to attend [employer].  They did change to online classes in March 2020.  They are planning to go back to in-person classes in the fall, although the patient is not inclined to do this.    Review of Systems  Constitutional: Negative.  Negative for fever, malaise/fatigue and weight loss.  HENT: Negative.  Negative for congestion, ear discharge and ear pain.   Eyes: Negative for pain, discharge and redness.  Respiratory: Negative for cough, sputum production, shortness of breath and  wheezing.   Cardiovascular: Negative.  Negative for chest pain and palpitations.  Gastrointestinal: Negative for abdominal pain, heartburn, nausea and vomiting.  Skin: Negative.  Negative for itching and rash.  Neurological: Negative for dizziness and headaches.  Endo/Heme/Allergies: Negative for environmental allergies. Does not bruise/bleed easily.       Objective:   Blood pressure (!) 98/56, pulse 92, temperature 97.9 F (36.6 C), temperature source Temporal, resp. rate 16, height 5\' 6"  (1.676 m), weight 256 lb 3.2 oz (116.2 kg), last menstrual period 09/06/2018, SpO2 99 %. Body mass index is 41.35 kg/m.   Physical Exam:  Physical Exam  Constitutional: She appears well-developed.  Pleasant female.  HENT:  Head: Normocephalic and atraumatic.  Right Ear: Tympanic membrane, external ear and ear canal normal.  Left Ear: Tympanic membrane and ear canal normal.  Nose: No mucosal edema, rhinorrhea, nasal deformity or septal deviation. No epistaxis. Right sinus exhibits no maxillary sinus tenderness and no frontal sinus tenderness. Left sinus exhibits no maxillary sinus tenderness and no frontal sinus tenderness.  Mouth/Throat:  Uvula is midline and oropharynx is clear and moist. Mucous membranes are not pale and not dry.  Eyes: Pupils are equal, round, and reactive to light. Conjunctivae and EOM are normal. Right eye exhibits no chemosis and no discharge. Left eye exhibits no chemosis and no discharge. Right conjunctiva is not injected. Left conjunctiva is not injected.  Cardiovascular: Normal rate, regular rhythm and normal heart sounds.  Respiratory: Effort normal and breath sounds normal. No accessory muscle usage. No tachypnea. No respiratory distress. She has no wheezes. She has no rhonchi. She has no rales. She exhibits no tenderness.  Moving air well in all lung fields.  Lymphadenopathy:    She has no cervical adenopathy.  Neurological: She is alert.  Skin: No abrasion, no  petechiae and no rash noted. Rash is not papular, not vesicular and not urticarial. No erythema. No pallor.  No eczematous or urticarial lesions noted.  Psychiatric: She has a normal mood and affect.     Diagnostic studies: none     Salvatore Marvel, MD  [facility]

## 2018-09-19 NOTE — Patient Instructions (Addendum)
1. Mild intermittent asthma, uncomplicated - We did not do testing today for your lung function. - Daily controller medication(s): NONE - Prior to physical activity: ProAir 2 puffs 10-15 minutes before physical activity. - Rescue medications: ProAir 4 puffs every 4-6 hours as needed - Asthma control goals:  * Full participation in all desired activities (may need albuterol before activity) * Albuterol use two time or less a week on average (not counting use with activity) * Cough interfering with sleep two time or less a month * Oral steroids no more than once a year * No hospitalizations  2. Seasonal and perennial allergic rhinitis (trees, weeds, grasses, cat, dog, molds, and cockroach) - Continue with: Claritin-D as needed, Zyrtec (cetirizine) 10mg  tablet once daily and Flonase (fluticasone) one spray per nostril daily  - Continue allergy shots at the same schedule.  - You can use an extra dose of the antihistamine, if needed, for breakthrough symptoms.  - Consider nasal saline rinses 1-2 times daily to remove allergens from the nasal cavities as well as help with mucous clearance (this is especially helpful to do before the nasal sprays are given)  3. Adverse food reaction (shellfish, cow's milk, peanuts, tree nuts) - Continue to avoid your triggering foods.  - EpiPen up to date.  - Consider a food challenge to peanuts and tree nuts once COVID19 has calmed down.   4. Return in about 1 year (around 09/19/2019). This can be an in-person, a virtual Webex or a telephone follow up visit.   Please inform us of any Emergency Department visits, hospitalizations, or changes in symptoms. Call us before going to the ED for breathing or allergy symptoms since we might be able to fit you in for a sick visit. Feel free to contact us anytime with any questions, problems, or concerns.  It was a pleasure to see you again today!  Websites that have reliable patient information: 1. American Academy of  Asthma, Allergy, and Immunology: www.aaaai.org 2. Food Allergy Research and Education (FARE): foodallergy.org 3. Mothers of Asthmatics: http://www.asthmacommunitynetwork.org 4. American College of Allergy, Asthma, and Immunology: www.acaai.org  "Like" [place] on Facebook and Instagram for our latest updates!      Make sure you are registered to vote! If you have moved or changed any of your contact information, you will need to get this updated before voting!  In some cases, you MAY be able to register to vote online: [email or web address]    Voter ID laws are NOT going into effect for the General Election in November 2020! DO NOT let this stop you from exercising your right to vote!   Absentee voting is the SAFEST way to vote during the coronavirus pandemic!   Download and print an absentee ballot request form at [email or web address] or you can scan the QR code below with your smart phone:      More information on absentee ballots can be found here: [email or web address]

## 2018-09-19 NOTE — Progress Notes (Signed)
VIALS EXP 09-19-2019 

## 2018-09-20 DIAGNOSIS — J301 Allergic rhinitis due to pollen: Secondary | ICD-10-CM | POA: Diagnosis not present

## 2018-09-26 ENCOUNTER — Ambulatory Visit (INDEPENDENT_AMBULATORY_CARE_PROVIDER_SITE_OTHER): Payer: BLUE CROSS/BLUE SHIELD

## 2018-09-26 ENCOUNTER — Other Ambulatory Visit: Payer: Self-pay

## 2018-09-26 DIAGNOSIS — J309 Allergic rhinitis, unspecified: Secondary | ICD-10-CM | POA: Diagnosis not present

## 2018-10-03 ENCOUNTER — Other Ambulatory Visit: Payer: Self-pay

## 2018-10-03 ENCOUNTER — Ambulatory Visit (INDEPENDENT_AMBULATORY_CARE_PROVIDER_SITE_OTHER): Payer: BLUE CROSS/BLUE SHIELD

## 2018-10-03 DIAGNOSIS — J309 Allergic rhinitis, unspecified: Secondary | ICD-10-CM | POA: Diagnosis not present

## 2018-10-10 ENCOUNTER — Other Ambulatory Visit: Payer: Self-pay | Admitting: Allergy & Immunology

## 2018-10-10 ENCOUNTER — Ambulatory Visit (INDEPENDENT_AMBULATORY_CARE_PROVIDER_SITE_OTHER): Payer: BLUE CROSS/BLUE SHIELD

## 2018-10-10 ENCOUNTER — Other Ambulatory Visit: Payer: Self-pay

## 2018-10-10 DIAGNOSIS — J309 Allergic rhinitis, unspecified: Secondary | ICD-10-CM | POA: Diagnosis not present

## 2018-10-10 NOTE — Telephone Encounter (Signed)
Dr Ernst Bowler please advise if we can send this in I do not see it on your last office note

## 2018-10-11 NOTE — Telephone Encounter (Signed)
Sure we can refill it. We will discuss at the next visit.  Salvatore Marvel, MD [facility]

## 2018-10-12 NOTE — Telephone Encounter (Signed)
Noted  

## 2018-10-17 ENCOUNTER — Ambulatory Visit (INDEPENDENT_AMBULATORY_CARE_PROVIDER_SITE_OTHER): Payer: BLUE CROSS/BLUE SHIELD | Admitting: Allergy

## 2018-10-17 ENCOUNTER — Encounter: Payer: Self-pay | Admitting: Allergy

## 2018-10-17 ENCOUNTER — Other Ambulatory Visit: Payer: Self-pay

## 2018-10-17 DIAGNOSIS — T7800XD Anaphylactic reaction due to unspecified food, subsequent encounter: Secondary | ICD-10-CM | POA: Diagnosis not present

## 2018-10-17 DIAGNOSIS — H1010 Acute atopic conjunctivitis, unspecified eye: Secondary | ICD-10-CM | POA: Insufficient documentation

## 2018-10-17 DIAGNOSIS — J302 Other seasonal allergic rhinitis: Secondary | ICD-10-CM

## 2018-10-17 DIAGNOSIS — J309 Allergic rhinitis, unspecified: Secondary | ICD-10-CM

## 2018-10-17 DIAGNOSIS — J4520 Mild intermittent asthma, uncomplicated: Secondary | ICD-10-CM

## 2018-10-17 DIAGNOSIS — Z8709 Personal history of other diseases of the respiratory system: Secondary | ICD-10-CM | POA: Diagnosis not present

## 2018-10-17 DIAGNOSIS — J3089 Other allergic rhinitis: Secondary | ICD-10-CM

## 2018-10-17 NOTE — Assessment & Plan Note (Addendum)
Diagnosed with asthma since birth. Currently well-controlled. Main triggers are allergies and URIs.  Today's spirometry was normal. . Daily controller medication(s): None. . Prior to physical activity: May use albuterol rescue inhaler 2 puffs 5 to 15 minutes prior to strenuous physical activities. Marland Kitchen Rescue medications: May use albuterol rescue inhaler 2 puffs or nebulizer every 4 to 6 hours as needed for shortness of breath, chest tightness, coughing, and wheezing. Monitor frequency of use.  . Letter for school written regarding COVID-19 and her risks.

## 2018-10-17 NOTE — Assessment & Plan Note (Signed)
Past history - 2019 bloodwork mildly positive to peanuts, tree nuts. 2019 skin testing positive to shellfish and milk. Interim history - No accidental ingestions.  Continue to avoid your triggering foods.   EpiPen up to date.   For mild symptoms you can take over the counter antihistamines such as Benadryl and monitor symptoms closely. If symptoms worsen or if you have severe symptoms including breathing issues, throat closure, significant swelling, whole body hives, severe diarrhea and vomiting, lightheadedness then inject epinephrine and seek immediate medical care afterwards.  Consider a food challenge to peanuts and tree nuts once COVID19 has calmed down.

## 2018-10-17 NOTE — Assessment & Plan Note (Signed)
Past history - patient started allergy injections in [place]. 2019 bloodwork positive to dust mites, cat, dog, grasses, cockroach, trees, and weeds.  Interim history - tolerating injections weekly with no issues.  Continue weekly allergy injections.  May use over the counter antihistamines such as Zyrtec (cetirizine), Claritin (loratadine), Allegra (fexofenadine), or Xyzal (levocetirizine) daily as needed.  May take twice a day for breakthrough symptoms.   Continue Singulair 10mg  daily.  May use Flonase 1 spray per nostril twice a day as needed.  Nasal saline spray (i.e., Simply Saline) or nasal saline lavage (i.e., NeilMed) is recommended as needed and prior to medicated nasal sprays.  May use olopatadine eye drops 1 in each eye twice a day as needed for itchy/watery eyes.   Continue environmental control measures.

## 2018-10-17 NOTE — Progress Notes (Signed)
Follow Up Note  RE: Desiree Sullivan MRN: [id number] DOB: 01/27/1996 Date of Office Visit: 10/17/2018  Referring provider: No ref. provider found Primary care provider: Patient, No Pcp Per  Chief Complaint: No chief complaint on file.  History of Present Illness: I had the pleasure of seeing Desiree RumpsKayla Sullivan for a follow up visit at the [facility] on 10/17/2018. She is a 23 y.o. female, who is being followed for asthma, allergic rhinitis, adverse food reaction. Today she is here for new complaint of wanting to discuss going back to school and her risks. She is accompanied today by her mother who provided/contributed to the history. Her previous allergy office visit was on 09/19/2018 with Dr. Dellis AnesGallagher.   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 Mother and patient concerned about going to in-person lectures and inquired after online learning however the school stated that she has to have a letter from a physician stating her high ris conditions.   She was initially seen in our office in 2019 as a transfer patient from [place] when [place] moved to [facility]  She was apparently diagnosed with asthma since birth and currently it is well controlled. Her main triggers are environmental allergies and URIs.   Denies any SOB, coughing, wheezing, chest tightness, nocturnal awakenings, ER/urgent care visits or prednisone use since the last visit. Used albuterol once last week when around dogs.  Patient did go to the student center frequently last year due to URIs. She does not think she received any recent antibiotics or prednisone at the student health center.   Patient is going to be living in an apartment with 1 roommate.  Seasonal and perennial allergic rhinitis (trees, weeds, grasses, cat, dog, molds, and cockroach) Currently on weekly allergy injections and tolerating it well with some localized reactions. Taking zyrtec or Claritin prn.  Takes  Flonase and eye drops as needed. Takes Singulair daily.   3. Adverse food reaction (shellfish, cow's milk, peanuts, tree nuts) Avoiding shellfish, milk, peanuts, tree nuts. No accidental ingestions.   Assessment and Plan: Desiree Sullivan is a 23 y.o. female with: Mild intermittent asthma, uncomplicated Diagnosed with asthma since birth. Currently well-controlled. Main triggers are allergies and URIs.  Today's spirometry was normal.  Daily controller medication(s): None.  Prior to physical activity: May use albuterol rescue inhaler 2 puffs 5 to 15 minutes prior to strenuous physical activities.  Rescue medications: May use albuterol rescue inhaler 2 puffs or nebulizer every 4 to 6 hours as needed for shortness of breath, chest tightness, coughing, and wheezing. Monitor frequency of use.   Letter for school written regarding COVID-19 and her risks.   Seasonal and perennial allergic rhinoconjunctivitis Past history - patient started allergy injections in [place]. 2019 bloodwork positive to dust mites, cat, dog, grasses, cockroach, trees, and weeds.  Interim history - tolerating injections weekly with no issues.  Continue weekly allergy injections.  May use over the counter antihistamines such as Zyrtec (cetirizine), Claritin (loratadine), Allegra (fexofenadine), or Xyzal (levocetirizine) daily as needed.  May take twice a day for breakthrough symptoms.   Continue Singulair 10mg  daily.  May use Flonase 1 spray per nostril twice a day as needed.  Nasal saline spray (i.e., Simply Saline) or nasal saline lavage (i.e., NeilMed) is recommended as needed and prior to medicated nasal sprays.  May use olopatadine eye drops 1 in each eye twice a day as needed for itchy/watery eyes.   Continue  environmental control measures.   Anaphylactic shock due to adverse food reaction Past history - 2019 bloodwork mildly positive to peanuts, tree nuts. 2019 skin testing positive to shellfish and  milk. Interim history - No accidental ingestions.  Continue to avoid your triggering foods.   EpiPen up to date.   For mild symptoms you can take over the counter antihistamines such as Benadryl and monitor symptoms closely. If symptoms worsen or if you have severe symptoms including breathing issues, throat closure, significant swelling, whole body hives, severe diarrhea and vomiting, lightheadedness then inject epinephrine and seek immediate medical care afterwards.  Consider a food challenge to peanuts and tree nuts once COVID19 has calmed down.   History of frequent upper respiratory infection History of frequent URIs which tend to trigger her asthma. No recent antibiotic use and no previous immune evaluation.  Get bloodwork to look at immune system.  Keep track of infections.  Recommend annual flu vaccine.   Return in about 6 months (around 04/19/2019).  Lab Orders     CBC with Differential/Platelet     Diphtheria / Tetanus Antibody Panel     Strep pneumoniae 23 Serotypes IgG  Diagnostics: Spirometry:  Tracings reviewed. Her effort: Good reproducible efforts. FVC: 5.44L FEV1: 4.49L, 151% predicted FEV1/FVC ratio: 83% Interpretation: Spirometry consistent with normal pattern.  Please see scanned spirometry results for details.  Medication List:  Current Outpatient Medications  Medication Sig Dispense Refill   adapalene (DIFFERIN) 0.1 % cream adapalene 0.1 % topical cream  APPLY A THIN LAYER TO THE AFFFECTED AREAS EVERY OTHER DAY AS DIRECTED     albuterol (PROAIR HFA) 108 (90 Base) MCG/ACT inhaler Inhale into the lungs every 6 (six) hours as needed for wheezing or shortness of breath.     albuterol (PROVENTIL) (2.5 MG/3ML) 0.083% nebulizer solution Take 3 mLs (2.5 mg total) by nebulization every 4 (four) hours as needed for wheezing or shortness of breath. 75 mL 1   cetirizine (ZYRTEC) 10 MG tablet Take 10 mg by mouth daily.     clindamycin (CLEOCIN T) 1 % lotion  clindamycin 1 % lotion  APPLY A THIN LAYER TO THE AFFECTED AREA(S) ONCE DAILY AS DIRECTED     desonide (DESOWEN) 0.05 % cream desonide 0.05 % topical cream  APPLY SPARINGLY TO THE AFFECTED AREA(S) ON FACE AND NECK 2 TIMES PER DAY FOR MAX OF 1 WEEK     EPINEPHrine (AUVI-Q) 0.3 mg/0.3 mL IJ SOAJ injection Inject 0.3 mLs (0.3 mg total) into the muscle as needed for anaphylaxis. 1 each 0   fluticasone (FLONASE) 50 MCG/ACT nasal spray Place into both nostrils daily.     loratadine-pseudoephedrine (CLARITIN-D 24-HOUR) 10-240 MG 24 hr tablet Take 1 tablet by mouth daily.     mometasone (ELOCON) 0.1 % cream Apply once a day on neck lesion for 10 days, then every other day for 10 days and stop it     montelukast (SINGULAIR) 10 MG tablet TAKE 1 TABLET BY MOUTH EVERYDAY AT BEDTIME 30 tablet 0   omeprazole (PRILOSEC) 40 MG capsule Take by mouth.     triamcinolone cream (KENALOG) 0.1 % Apply 1-2 times daily to dry itchy areas on body. Do not apply to face, neck, or skin folds.     levocetirizine (XYZAL) 5 MG tablet as needed.     Multiple Vitamin (MULTI-VITAMIN) tablet Take by mouth.     olopatadine (PATANOL) 0.1 % ophthalmic solution olopatadine 0.1 % eye drops  Instill 1 drop/eye twice/day as needed  No current facility-administered medications for this visit.    Allergies: Allergies  Allergen Reactions   Peanut-Containing Drug Products Anaphylaxis   Shellfish Allergy Anaphylaxis   Dairy Aid [Lactase]     pain   Wheat Bran Hives and Itching   Yeast-Related Products Itching   I reviewed her past medical history, social history, family history, and environmental history and no significant changes have been reported from previous visit on 09/19/2018.  Review of Systems  Constitutional: Negative for appetite change, chills, fever and unexpected weight change.  HENT: Negative for congestion and rhinorrhea.   Eyes: Negative for itching.  Respiratory: Negative for cough, chest  tightness, shortness of breath and wheezing.   Gastrointestinal: Negative for abdominal pain.  Skin: Negative for rash.  Allergic/Immunologic: Positive for environmental allergies and food allergies.  Neurological: Negative for headaches.   Objective: There were no vitals taken for this visit. There is no height or weight on file to calculate BMI. Physical Exam  Constitutional: She is oriented to person, place, and time. She appears well-developed and well-nourished.  HENT:  Head: Normocephalic and atraumatic.  Right Ear: External ear normal.  Left Ear: External ear normal.  Nose: Nose normal.  Mouth/Throat: Oropharynx is clear and moist.  Eyes: Conjunctivae and EOM are normal.  Neck: Neck supple.  Cardiovascular: Normal rate, regular rhythm and normal heart sounds. Exam reveals no gallop and no friction rub.  No murmur heard. Pulmonary/Chest: Effort normal and breath sounds normal. She has no wheezes. She has no rales.  Neurological: She is alert and oriented to person, place, and time.  Skin: Skin is warm. No rash noted.  Psychiatric: She has a normal mood and affect. Her behavior is normal.  Nursing note and vitals reviewed.  Previous notes and tests were reviewed. The plan was reviewed with the patient/family, and all questions/concerned were addressed.  It was my pleasure to see Rachyl today and participate in her care. Please feel free to contact me with any questions or concerns.  Sincerely,  Rexene Alberts, DO Allergy & Immunology  [facility] office: [phone] [facility] office: [place] office: [phone]

## 2018-10-17 NOTE — Assessment & Plan Note (Signed)
History of frequent URIs which tend to trigger her asthma. No recent antibiotic use and no previous immune evaluation.  Get bloodwork to look at immune system.  Keep track of infections.  Recommend annual flu vaccine.

## 2018-10-17 NOTE — Patient Instructions (Addendum)
I will have the letter for you ready to be picked up next week. Get bloodwork.  Your breathing test looked good today.   1. Mild intermittent asthma, uncomplicated . Daily controller medication(s): None. . Prior to physical activity: May use albuterol rescue inhaler 2 puffs 5 to 15 minutes prior to strenuous physical activities. Marland Kitchen Rescue medications: May use albuterol rescue inhaler 2 puffs or nebulizer every 4 to 6 hours as needed for shortness of breath, chest tightness, coughing, and wheezing. Monitor frequency of use.  . Asthma control goals:  o Full participation in all desired activities (may need albuterol before activity) o Albuterol use two times or less a week on average (not counting use with activity) o Cough interfering with sleep two times or less a month o Oral steroids no more than once a year o No hospitalizations  2. Seasonal and perennial allergic rhinitis (trees, weeds, grasses, cat, dog, molds, and cockroach)  Continue weekly allerg injections.  May use over the counter antihistamines such as Zyrtec (cetirizine), Claritin (loratadine), Allegra (fexofenadine), or Xyzal (levocetirizine) daily as needed.  May take twice a day for breakthrough symptoms.   Continue Singulair 10mg  daily.  May use Flonase 1 spray per nostril twice a day as needed.  Nasal saline spray (i.e., Simply Saline) or nasal saline lavage (i.e., NeilMed) is recommended as needed and prior to medicated nasal sprays.  May use olopatadine eye drops 1 in each eye twice a day as needed for itchy/watery eyes.   3. Adverse food reaction (shellfish, cow's milk, peanuts, tree nuts)  Continue to avoid your triggering foods.   EpiPen up to date.   For mild symptoms you can take over the counter antihistamines such as Benadryl and monitor symptoms closely. If symptoms worsen or if you have severe symptoms including breathing issues, throat closure, significant swelling, whole body hives, severe diarrhea  and vomiting, lightheadedness then inject epinephrine and seek immediate medical care afterwards.  Consider a food challenge to peanuts and tree nuts once COVID19 has calmed down.   4. Infections  Keep track of infections.  Get bloodwork.  Follow up in 6 months

## 2018-10-24 ENCOUNTER — Ambulatory Visit (INDEPENDENT_AMBULATORY_CARE_PROVIDER_SITE_OTHER): Payer: BLUE CROSS/BLUE SHIELD

## 2018-10-24 ENCOUNTER — Other Ambulatory Visit: Payer: Self-pay

## 2018-10-24 DIAGNOSIS — J309 Allergic rhinitis, unspecified: Secondary | ICD-10-CM | POA: Diagnosis not present

## 2018-10-27 ENCOUNTER — Encounter: Payer: Self-pay | Admitting: Allergy

## 2018-10-27 DIAGNOSIS — Z8709 Personal history of other diseases of the respiratory system: Secondary | ICD-10-CM

## 2018-10-27 LAB — CBC WITH DIFFERENTIAL/PLATELET
Basophils Absolute: 0 10*3/uL (ref 0.0–0.2)
Basos: 0 %
EOS (ABSOLUTE): 0.2 10*3/uL (ref 0.0–0.4)
Eos: 2 %
Hematocrit: 41.4 % (ref 34.0–46.6)
Hemoglobin: 14.1 g/dL (ref 11.1–15.9)
Immature Grans (Abs): 0 10*3/uL (ref 0.0–0.1)
Immature Granulocytes: 0 %
Lymphocytes Absolute: 2.2 10*3/uL (ref 0.7–3.1)
Lymphs: 30 %
MCH: 28 pg (ref 26.6–33.0)
MCHC: 34.1 g/dL (ref 31.5–35.7)
MCV: 82 fL (ref 79–97)
Monocytes Absolute: 0.6 10*3/uL (ref 0.1–0.9)
Monocytes: 8 %
Neutrophils Absolute: 4.3 10*3/uL (ref 1.4–7.0)
Neutrophils: 60 %
Platelets: 311 10*3/uL (ref 150–450)
RBC: 5.04 x10E6/uL (ref 3.77–5.28)
RDW: 13.6 % (ref 11.7–15.4)
WBC: 7.2 10*3/uL (ref 3.4–10.8)

## 2018-10-27 LAB — STREP PNEUMONIAE 23 SEROTYPES IGG
Pneumo Ab Type 1*: 0.6 ug/mL — ABNORMAL LOW (ref >1.3)
Pneumo Ab Type 12 (12F)*: 0.5 ug/mL — ABNORMAL LOW (ref >1.3)
Pneumo Ab Type 14*: 0.4 ug/mL — ABNORMAL LOW (ref >1.3)
Pneumo Ab Type 17 (17F)*: 0.4 ug/mL — ABNORMAL LOW (ref >1.3)
Pneumo Ab Type 19 (19F)*: 2 ug/mL (ref >1.3)
Pneumo Ab Type 2*: 0.4 ug/mL — ABNORMAL LOW (ref >1.3)
Pneumo Ab Type 20*: 0.6 ug/mL — ABNORMAL LOW (ref >1.3)
Pneumo Ab Type 22 (22F)*: 0.6 ug/mL — ABNORMAL LOW (ref >1.3)
Pneumo Ab Type 23 (23F)*: 0.2 ug/mL — ABNORMAL LOW (ref >1.3)
Pneumo Ab Type 26 (6B)*: <0.1 ug/mL — ABNORMAL LOW (ref >1.3)
Pneumo Ab Type 3*: 6 ug/mL (ref >1.3)
Pneumo Ab Type 34 (10A)*: 0.3 ug/mL — ABNORMAL LOW (ref >1.3)
Pneumo Ab Type 4*: 0.6 ug/mL — ABNORMAL LOW (ref >1.3)
Pneumo Ab Type 43 (11A)*: 0.2 ug/mL — ABNORMAL LOW (ref >1.3)
Pneumo Ab Type 5*: <0.1 ug/mL — ABNORMAL LOW (ref >1.3)
Pneumo Ab Type 51 (7F)*: 1.6 ug/mL (ref >1.3)
Pneumo Ab Type 54 (15B)*: 1.1 ug/mL — ABNORMAL LOW (ref >1.3)
Pneumo Ab Type 56 (18C)*: 0.6 ug/mL — ABNORMAL LOW (ref >1.3)
Pneumo Ab Type 57 (19A)*: 3.6 ug/mL (ref >1.3)
Pneumo Ab Type 68 (9V)*: 1 ug/mL — ABNORMAL LOW (ref >1.3)
Pneumo Ab Type 70 (33F)*: 0.6 ug/mL — ABNORMAL LOW (ref >1.3)
Pneumo Ab Type 8*: 0.2 ug/mL — ABNORMAL LOW (ref >1.3)
Pneumo Ab Type 9 (9N)*: 0.4 ug/mL — ABNORMAL LOW (ref >1.3)

## 2018-10-27 LAB — DIPHTHERIA / TETANUS ANTIBODY PANEL
Diphtheria Ab: 0.8 IU/mL (ref <0.10)
Tetanus Ab, IgG: 2.61 IU/mL (ref <0.10)

## 2018-11-02 ENCOUNTER — Other Ambulatory Visit: Payer: Self-pay

## 2018-11-02 ENCOUNTER — Telehealth: Payer: Self-pay | Admitting: Allergy

## 2018-11-02 ENCOUNTER — Ambulatory Visit (INDEPENDENT_AMBULATORY_CARE_PROVIDER_SITE_OTHER): Payer: BLUE CROSS/BLUE SHIELD

## 2018-11-02 DIAGNOSIS — J309 Allergic rhinitis, unspecified: Secondary | ICD-10-CM | POA: Diagnosis not present

## 2018-11-02 MED ORDER — ALBUTEROL SULFATE HFA 108 (90 BASE) MCG/ACT IN AERS: 2.0000 | INHALATION_SPRAY | Freq: Four times a day (QID) | RESPIRATORY_TRACT | 1 refills | Status: DC | PRN

## 2018-11-02 MED ORDER — MONTELUKAST SODIUM 10 MG PO TABS: ORAL_TABLET | ORAL | 5 refills | Status: DC

## 2018-11-02 MED ORDER — FLUTICASONE PROPIONATE 50 MCG/ACT NA SUSP: 1.0000 | Freq: Two times a day (BID) | NASAL | 5 refills | Status: DC | PRN

## 2018-11-02 MED ORDER — TRIAMCINOLONE ACETONIDE 0.1 % EX CREA: TOPICAL_CREAM | CUTANEOUS | 3 refills | Status: DC

## 2018-11-02 MED ORDER — CETIRIZINE HCL 10 MG PO TABS: 10.0000 mg | ORAL_TABLET | Freq: Every day | ORAL | 5 refills | Status: AC

## 2018-11-02 MED ORDER — OLOPATADINE HCL 0.1 % OP SOLN: 1.0000 [drp] | Freq: Two times a day (BID) | OPHTHALMIC | 5 refills | Status: AC | PRN

## 2018-11-02 MED ORDER — ALBUTEROL SULFATE (2.5 MG/3ML) 0.083% IN NEBU: 2.5000 mg | INHALATION_SOLUTION | RESPIRATORY_TRACT | 1 refills | Status: DC | PRN

## 2018-11-02 MED ORDER — LEVOCETIRIZINE DIHYDROCHLORIDE 5 MG PO TABS: ORAL_TABLET | ORAL | 5 refills | Status: AC

## 2018-11-02 NOTE — Telephone Encounter (Signed)
Mom informed me that patient will need all of her medications sent to the [facility] on [address]. Patient was seen 10/17/2018. Rx sent in.

## 2018-11-06 ENCOUNTER — Other Ambulatory Visit: Payer: Self-pay | Admitting: Allergy & Immunology

## 2018-11-07 ENCOUNTER — Other Ambulatory Visit: Payer: Self-pay

## 2018-11-07 ENCOUNTER — Ambulatory Visit (INDEPENDENT_AMBULATORY_CARE_PROVIDER_SITE_OTHER): Payer: BLUE CROSS/BLUE SHIELD

## 2018-11-07 DIAGNOSIS — J309 Allergic rhinitis, unspecified: Secondary | ICD-10-CM

## 2018-11-10 ENCOUNTER — Telehealth: Payer: Self-pay

## 2018-11-10 DIAGNOSIS — Z8709 Personal history of other diseases of the respiratory system: Secondary | ICD-10-CM

## 2018-11-10 NOTE — Telephone Encounter (Signed)
Received a message that the patient has not read her my chart message in regards to her lab results. I called and spoke with patient, informed her of her lab results and Dr. Julianne Rice recommendation to get the pneumovax vaccine and repeat labs 4 weeks after receiving the pneumovax injection. Patient verbalized understanding.

## 2018-11-16 ENCOUNTER — Other Ambulatory Visit: Payer: Self-pay

## 2018-11-16 ENCOUNTER — Ambulatory Visit (INDEPENDENT_AMBULATORY_CARE_PROVIDER_SITE_OTHER): Payer: BLUE CROSS/BLUE SHIELD

## 2018-11-16 DIAGNOSIS — J309 Allergic rhinitis, unspecified: Secondary | ICD-10-CM

## 2018-11-23 ENCOUNTER — Ambulatory Visit (INDEPENDENT_AMBULATORY_CARE_PROVIDER_SITE_OTHER): Payer: BLUE CROSS/BLUE SHIELD

## 2018-11-23 ENCOUNTER — Other Ambulatory Visit: Payer: Self-pay

## 2018-11-23 DIAGNOSIS — J309 Allergic rhinitis, unspecified: Secondary | ICD-10-CM | POA: Diagnosis not present

## 2018-11-30 ENCOUNTER — Other Ambulatory Visit: Payer: Self-pay

## 2018-11-30 ENCOUNTER — Ambulatory Visit (INDEPENDENT_AMBULATORY_CARE_PROVIDER_SITE_OTHER): Payer: BLUE CROSS/BLUE SHIELD

## 2018-11-30 DIAGNOSIS — J309 Allergic rhinitis, unspecified: Secondary | ICD-10-CM | POA: Diagnosis not present

## 2018-11-30 NOTE — Addendum Note (Signed)
Addended by: Garnet Sierras on: 11/30/2018 03:27 PM   Modules accepted: Orders

## 2018-12-14 ENCOUNTER — Other Ambulatory Visit: Payer: Self-pay

## 2018-12-14 ENCOUNTER — Ambulatory Visit (INDEPENDENT_AMBULATORY_CARE_PROVIDER_SITE_OTHER): Payer: BLUE CROSS/BLUE SHIELD

## 2018-12-14 DIAGNOSIS — J309 Allergic rhinitis, unspecified: Secondary | ICD-10-CM | POA: Diagnosis not present

## 2018-12-21 ENCOUNTER — Ambulatory Visit (INDEPENDENT_AMBULATORY_CARE_PROVIDER_SITE_OTHER): Payer: BLUE CROSS/BLUE SHIELD

## 2018-12-21 ENCOUNTER — Other Ambulatory Visit: Payer: Self-pay

## 2018-12-21 DIAGNOSIS — J309 Allergic rhinitis, unspecified: Secondary | ICD-10-CM

## 2018-12-26 ENCOUNTER — Ambulatory Visit (INDEPENDENT_AMBULATORY_CARE_PROVIDER_SITE_OTHER): Payer: BLUE CROSS/BLUE SHIELD

## 2018-12-26 ENCOUNTER — Other Ambulatory Visit: Payer: Self-pay

## 2018-12-26 DIAGNOSIS — J309 Allergic rhinitis, unspecified: Secondary | ICD-10-CM

## 2019-01-01 DIAGNOSIS — J301 Allergic rhinitis due to pollen: Secondary | ICD-10-CM

## 2019-01-01 NOTE — Progress Notes (Signed)
VIALS EXP 01-01-20

## 2019-01-04 ENCOUNTER — Ambulatory Visit (INDEPENDENT_AMBULATORY_CARE_PROVIDER_SITE_OTHER): Payer: BLUE CROSS/BLUE SHIELD

## 2019-01-04 ENCOUNTER — Other Ambulatory Visit: Payer: Self-pay

## 2019-01-04 DIAGNOSIS — J309 Allergic rhinitis, unspecified: Secondary | ICD-10-CM | POA: Diagnosis not present

## 2019-01-18 ENCOUNTER — Other Ambulatory Visit: Payer: Self-pay

## 2019-01-18 ENCOUNTER — Ambulatory Visit (INDEPENDENT_AMBULATORY_CARE_PROVIDER_SITE_OTHER): Payer: BLUE CROSS/BLUE SHIELD

## 2019-01-18 DIAGNOSIS — J309 Allergic rhinitis, unspecified: Secondary | ICD-10-CM

## 2019-02-01 ENCOUNTER — Ambulatory Visit (INDEPENDENT_AMBULATORY_CARE_PROVIDER_SITE_OTHER): Payer: BLUE CROSS/BLUE SHIELD

## 2019-02-01 DIAGNOSIS — J309 Allergic rhinitis, unspecified: Secondary | ICD-10-CM

## 2019-02-06 ENCOUNTER — Ambulatory Visit (INDEPENDENT_AMBULATORY_CARE_PROVIDER_SITE_OTHER): Payer: BLUE CROSS/BLUE SHIELD

## 2019-02-06 ENCOUNTER — Other Ambulatory Visit: Payer: Self-pay

## 2019-02-06 DIAGNOSIS — J309 Allergic rhinitis, unspecified: Secondary | ICD-10-CM | POA: Diagnosis not present

## 2019-02-15 ENCOUNTER — Other Ambulatory Visit: Payer: Self-pay

## 2019-02-15 ENCOUNTER — Ambulatory Visit (INDEPENDENT_AMBULATORY_CARE_PROVIDER_SITE_OTHER): Payer: BLUE CROSS/BLUE SHIELD

## 2019-02-15 DIAGNOSIS — J309 Allergic rhinitis, unspecified: Secondary | ICD-10-CM | POA: Diagnosis not present

## 2019-02-20 ENCOUNTER — Other Ambulatory Visit: Payer: Self-pay

## 2019-02-20 ENCOUNTER — Ambulatory Visit (INDEPENDENT_AMBULATORY_CARE_PROVIDER_SITE_OTHER): Payer: BLUE CROSS/BLUE SHIELD

## 2019-02-20 DIAGNOSIS — J309 Allergic rhinitis, unspecified: Secondary | ICD-10-CM

## 2019-03-01 ENCOUNTER — Ambulatory Visit (INDEPENDENT_AMBULATORY_CARE_PROVIDER_SITE_OTHER): Payer: BLUE CROSS/BLUE SHIELD

## 2019-03-01 ENCOUNTER — Other Ambulatory Visit: Payer: Self-pay

## 2019-03-01 DIAGNOSIS — J309 Allergic rhinitis, unspecified: Secondary | ICD-10-CM | POA: Diagnosis not present

## 2019-03-13 ENCOUNTER — Ambulatory Visit (INDEPENDENT_AMBULATORY_CARE_PROVIDER_SITE_OTHER): Payer: BLUE CROSS/BLUE SHIELD

## 2019-03-13 DIAGNOSIS — J309 Allergic rhinitis, unspecified: Secondary | ICD-10-CM | POA: Diagnosis not present

## 2019-03-29 ENCOUNTER — Ambulatory Visit (INDEPENDENT_AMBULATORY_CARE_PROVIDER_SITE_OTHER): Payer: BLUE CROSS/BLUE SHIELD

## 2019-03-29 ENCOUNTER — Other Ambulatory Visit: Payer: Self-pay

## 2019-03-29 DIAGNOSIS — J309 Allergic rhinitis, unspecified: Secondary | ICD-10-CM | POA: Diagnosis not present

## 2019-04-10 ENCOUNTER — Ambulatory Visit (INDEPENDENT_AMBULATORY_CARE_PROVIDER_SITE_OTHER): Payer: BLUE CROSS/BLUE SHIELD

## 2019-04-10 ENCOUNTER — Other Ambulatory Visit: Payer: Self-pay

## 2019-04-10 DIAGNOSIS — J309 Allergic rhinitis, unspecified: Secondary | ICD-10-CM

## 2019-04-11 DIAGNOSIS — J301 Allergic rhinitis due to pollen: Secondary | ICD-10-CM

## 2019-04-12 NOTE — Progress Notes (Signed)
Vials exp 02-19-21 

## 2019-04-20 ENCOUNTER — Ambulatory Visit (INDEPENDENT_AMBULATORY_CARE_PROVIDER_SITE_OTHER): Payer: BLUE CROSS/BLUE SHIELD

## 2019-04-20 ENCOUNTER — Other Ambulatory Visit: Payer: Self-pay

## 2019-04-20 ENCOUNTER — Emergency Department
Admission: EM | Admit: 2019-04-20 | Discharge: 2019-04-20 | Disposition: A | Discharge status: Home / Self Care | Payer: BLUE CROSS/BLUE SHIELD | Source: Home / Self Care

## 2019-04-20 DIAGNOSIS — M24412 Recurrent dislocation, left shoulder: Secondary | ICD-10-CM

## 2019-04-20 DIAGNOSIS — M25512 Pain in left shoulder: Secondary | ICD-10-CM | POA: Diagnosis not present

## 2019-04-20 NOTE — ED Triage Notes (Signed)
Pt c/o LT shoulder pain. Says she felt like it was dislocated last night, and she popped it back in. Pt had surgery on rotator cuff 4 years ago due to multiple dislocations. Pain 5/10

## 2019-04-20 NOTE — ED Provider Notes (Signed)
[facility]    CSN: [id number] Arrival date & time: 04/20/19  1531      History   Chief Complaint Chief Complaint  Patient presents with  . Shoulder Pain    LT    HPI Desiree Sullivan is a 24 y.o. female.   24 year old female, presenting today complaining of shoulder pain.  Patient states that last night, she felt as though her shoulder may have subluxed during the night.  States that she was able to put it back into place.  Has had pain since that time.  States that she is dislocated shoulder twice in the past and has required surgery about 5 to 6 years ago.  The history is provided by the patient.  Shoulder Pain Location:  Shoulder Shoulder location:  L shoulder Injury: no   Pain details:    Quality:  Aching   Radiates to:  Does not radiate   Severity:  Mild   Onset quality:  Gradual   Timing:  Constant   Progression:  Unchanged Handedness:  Right-handed Dislocation: no   Foreign body present:  No foreign bodies Tetanus status:  Unknown Prior injury to area:  No Relieved by:  Nothing Worsened by:  Movement Ineffective treatments:  Rest Associated symptoms: stiffness   Associated symptoms: no back pain, no decreased range of motion, no fatigue, no fever, no muscle weakness, no neck pain, no numbness, no swelling and no tingling   Risk factors: no concern for non-accidental trauma, no known bone disorder, no frequent fractures and no recent illness     Past Medical History:  Diagnosis Date  . Angio-edema   . Asthma   . Eczema   . Urticaria     Patient Active Problem List   Diagnosis Date Noted  . Seasonal and perennial allergic rhinoconjunctivitis 10/17/2018  . History of frequent upper respiratory infection 10/17/2018  . History of food allergy 10/17/2017  . Mild intermittent asthma, uncomplicated 08/17/2017  . Seasonal and perennial allergic rhinitis 08/17/2017  . Anaphylactic shock due to adverse food reaction 08/17/2017    Past Surgical  History:  Procedure Laterality Date  . SHOULDER SURGERY Left     OB History   No obstetric history on file.      Home Medications    Prior to Admission medications   Medication Sig Start Date End Date Taking? Authorizing Provider  adapalene (DIFFERIN) 0.1 % cream adapalene 0.1 % topical cream  APPLY A THIN LAYER TO THE AFFFECTED AREAS EVERY OTHER DAY AS DIRECTED 10/20/17   [provider]  albuterol (PROAIR HFA) 108 (90 Base) MCG/ACT inhaler Inhale 2 puffs into the lungs every 6 (six) hours as needed for wheezing or shortness of breath. 11/02/18   Ellamae Sia, DO  albuterol (PROVENTIL) (2.5 MG/3ML) 0.083% nebulizer solution Take 3 mLs (2.5 mg total) by nebulization every 4 (four) hours as needed for wheezing or shortness of breath. 11/02/18   Ellamae Sia, DO  cetirizine (ZYRTEC) 10 MG tablet Take 1 tablet (10 mg total) by mouth daily. 11/02/18   Ellamae Sia, DO  clindamycin (CLEOCIN T) 1 % lotion clindamycin 1 % lotion  APPLY A THIN LAYER TO THE AFFECTED AREA(S) ONCE DAILY AS DIRECTED 10/20/17   [provider]  desonide (DESOWEN) 0.05 % cream desonide 0.05 % topical cream  APPLY SPARINGLY TO THE AFFECTED AREA(S) ON FACE AND NECK 2 TIMES PER DAY FOR MAX OF 1 WEEK 10/20/17   [provider]  EPINEPHrine (AUVI-Q) 0.3  mg/0.3 mL IJ SOAJ injection Inject 0.3 mLs (0.3 mg total) into the muscle as needed for anaphylaxis. 09/05/18   Bobbitt, [place] Muta, MD  fluticasone (FLONASE) 50 MCG/ACT nasal spray Place 1 spray into both nostrils 2 (two) times daily as needed for allergies or rhinitis. 11/02/18   Garnet Sierras, DO  levocetirizine (XYZAL) 5 MG tablet as needed. 11/02/18   Garnet Sierras, DO  loratadine-pseudoephedrine (CLARITIN-D 24-HOUR) 10-240 MG 24 hr tablet Take 1 tablet by mouth daily.    [provider]  mometasone (ELOCON) 0.1 % cream Apply once a day on neck lesion for 10 days, then every other day for 10 days and stop it 05/12/18   [provider]    montelukast (SINGULAIR) 10 MG tablet TAKE 1 TABLET BY MOUTH EVERYDAY AT BEDTIME 11/06/18   Garnet Sierras, DO  Multiple Vitamin (MULTI-VITAMIN) tablet Take by mouth.    [provider]  olopatadine (PATANOL) 0.1 % ophthalmic solution Place 1 drop into both eyes 2 (two) times daily as needed for allergies. 11/02/18   Garnet Sierras, DO  omeprazole (PRILOSEC) 40 MG capsule Take by mouth. 05/02/18   [provider]  triamcinolone cream (KENALOG) 0.1 % Apply 1-2 times daily to dry itchy areas on body. Do not apply to face, neck, or skin folds. 11/02/18   Garnet Sierras, DO    Family History Family History  Problem Relation Age of Onset  . Allergic rhinitis Mother   . Asthma Mother   . Food Allergy Mother        wheat corn milk yeast  . Allergic rhinitis Father   . Asthma Father   . Food Allergy Father        shellfish   . Eczema Father   . Eczema Brother   . Angioedema Neg Hx     Social History Social History   Tobacco Use  . Smoking status: Never Smoker  . Smokeless tobacco: Never Used  Substance Use Topics  . Alcohol use: Never  . Drug use: Never     Allergies   Peanut-containing drug products, Shellfish allergy, Dairy aid [lactase], Wheat bran, and Yeast-related products   Review of Systems Review of Systems  Constitutional: Negative for chills, fatigue and fever.  HENT: Negative for ear pain and sore throat.   Eyes: Negative for pain and visual disturbance.  Respiratory: Negative for cough and shortness of breath.   Cardiovascular: Negative for chest pain and palpitations.  Gastrointestinal: Negative for abdominal pain and vomiting.  Genitourinary: Negative for dysuria and hematuria.  Musculoskeletal: Positive for arthralgias and stiffness. Negative for back pain and neck pain.  Skin: Negative for color change and rash.  Neurological: Negative for seizures and syncope.  All other systems reviewed and are negative.    Physical Exam Triage Vital Signs ED  Triage Vitals [04/20/19 1542]  Enc Vitals Group     BP 117/82     Pulse Rate 99     Resp 16     Temp 98.6 F (37 C)     Temp Source Oral     SpO2 98 %     Weight 281 lb (127.5 kg)     Height 5\' 6"  (1.676 m)     Head Circumference      Peak Flow      Pain Score 5     Pain Loc      Pain Edu?      Excl. in [place]?  No data found.  Updated Vital Signs BP 117/82 (BP Location: Right Arm)   Pulse 99   Temp 98.6 F (37 C) (Oral)   Resp 16   Ht 5\' 6"  (1.676 m)   Wt 281 lb (127.5 kg)   LMP 04/07/2019 (Exact Date)   SpO2 98%   BMI 45.35 kg/m   Visual Acuity Right Eye Distance:   Left Eye Distance:   Bilateral Distance:    Right Eye Near:   Left Eye Near:    Bilateral Near:     Physical Exam Vitals and nursing note reviewed.  Constitutional:      General: She is not in acute distress.    Appearance: She is well-developed.  HENT:     Head: Normocephalic and atraumatic.  Eyes:     Conjunctiva/sclera: Conjunctivae normal.  Cardiovascular:     Rate and Rhythm: Normal rate and regular rhythm.     Heart sounds: No murmur.  Pulmonary:     Effort: Pulmonary effort is normal. No respiratory distress.     Breath sounds: Normal breath sounds.  Abdominal:     Palpations: Abdomen is soft.     Tenderness: There is no abdominal tenderness.  Musculoskeletal:     Right shoulder: Normal.     Left shoulder: Tenderness present.     Cervical back: Neck supple.  Skin:    General: Skin is warm and dry.  Neurological:     Mental Status: She is alert.      UC Treatments / Results  Labs (all labs ordered are listed, but only abnormal results are displayed) Labs Reviewed - No data to display  EKG   Radiology DG Shoulder Left  Result Date: 04/20/2019 CLINICAL DATA:  Possible subluxation, recurrent shoulder dislocations EXAM: LEFT SHOULDER - 2+ VIEW COMPARISON:  None. FINDINGS: No fracture or dislocation of the left shoulder. Suspect a small glenohumeral joint effusion. Joint  spaces are well preserved. The partially imaged left chest is unremarkable. IMPRESSION: No fracture or dislocation of the left shoulder. The glenohumeral joint is in anatomic apposition and well preserved. Suspect a small glenohumeral joint effusion. Electronically Signed   By: 06/18/2019 M.D.   On: 04/20/2019 16:22    Procedures Procedures (including critical care time)  Medications Ordered in UC Medications - No data to display  Initial Impression / Assessment and Plan / UC Course  I have reviewed the triage vital signs and the nursing notes.  Pertinent labs & imaging results that were available during my care of the patient were reviewed by me and considered in my medical decision making (see chart for details).     Normal imaging of the shoulder aside from joint effusion Placed in a sling, RICE Given outpatient ortho referral  Final Clinical Impressions(s) / UC Diagnoses   Final diagnoses:  Acute pain of left shoulder   Discharge Instructions   None    ED Prescriptions    None     PDMP not reviewed this encounter.   06/18/2019, Alecia Lemming 04/20/19 1629

## 2019-04-24 ENCOUNTER — Ambulatory Visit (INDEPENDENT_AMBULATORY_CARE_PROVIDER_SITE_OTHER): Payer: BLUE CROSS/BLUE SHIELD

## 2019-04-24 ENCOUNTER — Other Ambulatory Visit: Payer: Self-pay

## 2019-04-24 DIAGNOSIS — J309 Allergic rhinitis, unspecified: Secondary | ICD-10-CM

## 2019-05-08 ENCOUNTER — Ambulatory Visit (INDEPENDENT_AMBULATORY_CARE_PROVIDER_SITE_OTHER): Payer: BLUE CROSS/BLUE SHIELD

## 2019-05-08 ENCOUNTER — Other Ambulatory Visit: Payer: Self-pay

## 2019-05-08 DIAGNOSIS — J309 Allergic rhinitis, unspecified: Secondary | ICD-10-CM | POA: Diagnosis not present

## 2019-05-17 ENCOUNTER — Ambulatory Visit (INDEPENDENT_AMBULATORY_CARE_PROVIDER_SITE_OTHER): Payer: BLUE CROSS/BLUE SHIELD

## 2019-05-17 ENCOUNTER — Other Ambulatory Visit: Payer: Self-pay

## 2019-05-17 DIAGNOSIS — J309 Allergic rhinitis, unspecified: Secondary | ICD-10-CM | POA: Diagnosis not present

## 2019-05-24 ENCOUNTER — Ambulatory Visit (INDEPENDENT_AMBULATORY_CARE_PROVIDER_SITE_OTHER): Payer: BLUE CROSS/BLUE SHIELD

## 2019-05-24 ENCOUNTER — Other Ambulatory Visit: Payer: Self-pay

## 2019-05-24 DIAGNOSIS — J309 Allergic rhinitis, unspecified: Secondary | ICD-10-CM

## 2019-06-07 ENCOUNTER — Other Ambulatory Visit: Payer: Self-pay

## 2019-06-07 ENCOUNTER — Ambulatory Visit (INDEPENDENT_AMBULATORY_CARE_PROVIDER_SITE_OTHER): Payer: BLUE CROSS/BLUE SHIELD

## 2019-06-07 DIAGNOSIS — J309 Allergic rhinitis, unspecified: Secondary | ICD-10-CM | POA: Diagnosis not present

## 2019-06-14 ENCOUNTER — Ambulatory Visit (INDEPENDENT_AMBULATORY_CARE_PROVIDER_SITE_OTHER): Payer: BLUE CROSS/BLUE SHIELD

## 2019-06-14 ENCOUNTER — Other Ambulatory Visit: Payer: Self-pay

## 2019-06-14 DIAGNOSIS — J309 Allergic rhinitis, unspecified: Secondary | ICD-10-CM | POA: Diagnosis not present

## 2019-06-21 ENCOUNTER — Other Ambulatory Visit: Payer: Self-pay

## 2019-06-21 ENCOUNTER — Ambulatory Visit (INDEPENDENT_AMBULATORY_CARE_PROVIDER_SITE_OTHER): Payer: BLUE CROSS/BLUE SHIELD

## 2019-06-21 DIAGNOSIS — J309 Allergic rhinitis, unspecified: Secondary | ICD-10-CM

## 2019-06-26 ENCOUNTER — Ambulatory Visit (INDEPENDENT_AMBULATORY_CARE_PROVIDER_SITE_OTHER): Payer: BLUE CROSS/BLUE SHIELD

## 2019-06-26 ENCOUNTER — Other Ambulatory Visit: Payer: Self-pay

## 2019-06-26 DIAGNOSIS — J309 Allergic rhinitis, unspecified: Secondary | ICD-10-CM | POA: Diagnosis not present

## 2019-07-12 ENCOUNTER — Ambulatory Visit (INDEPENDENT_AMBULATORY_CARE_PROVIDER_SITE_OTHER): Payer: BLUE CROSS/BLUE SHIELD

## 2019-07-12 ENCOUNTER — Other Ambulatory Visit: Payer: Self-pay

## 2019-07-12 DIAGNOSIS — J309 Allergic rhinitis, unspecified: Secondary | ICD-10-CM | POA: Diagnosis not present

## 2019-07-24 ENCOUNTER — Ambulatory Visit (INDEPENDENT_AMBULATORY_CARE_PROVIDER_SITE_OTHER): Payer: BLUE CROSS/BLUE SHIELD

## 2019-07-24 ENCOUNTER — Other Ambulatory Visit: Payer: Self-pay

## 2019-07-24 DIAGNOSIS — J309 Allergic rhinitis, unspecified: Secondary | ICD-10-CM

## 2019-08-04 ENCOUNTER — Other Ambulatory Visit: Payer: Self-pay | Admitting: Allergy

## 2019-08-09 ENCOUNTER — Other Ambulatory Visit: Payer: Self-pay

## 2019-08-09 ENCOUNTER — Ambulatory Visit (INDEPENDENT_AMBULATORY_CARE_PROVIDER_SITE_OTHER): Payer: BLUE CROSS/BLUE SHIELD

## 2019-08-09 DIAGNOSIS — J309 Allergic rhinitis, unspecified: Secondary | ICD-10-CM

## 2019-08-16 DIAGNOSIS — J301 Allergic rhinitis due to pollen: Secondary | ICD-10-CM | POA: Diagnosis not present

## 2019-08-16 NOTE — Progress Notes (Signed)
Vials exp 08-15-20 °

## 2019-08-21 ENCOUNTER — Other Ambulatory Visit: Payer: Self-pay

## 2019-08-21 ENCOUNTER — Ambulatory Visit (INDEPENDENT_AMBULATORY_CARE_PROVIDER_SITE_OTHER): Payer: BLUE CROSS/BLUE SHIELD

## 2019-08-21 DIAGNOSIS — J309 Allergic rhinitis, unspecified: Secondary | ICD-10-CM

## 2019-09-06 ENCOUNTER — Ambulatory Visit (INDEPENDENT_AMBULATORY_CARE_PROVIDER_SITE_OTHER): Payer: BLUE CROSS/BLUE SHIELD

## 2019-09-06 ENCOUNTER — Other Ambulatory Visit: Payer: Self-pay

## 2019-09-06 DIAGNOSIS — J309 Allergic rhinitis, unspecified: Secondary | ICD-10-CM | POA: Diagnosis not present

## 2019-09-20 ENCOUNTER — Other Ambulatory Visit: Payer: Self-pay

## 2019-09-20 ENCOUNTER — Ambulatory Visit (INDEPENDENT_AMBULATORY_CARE_PROVIDER_SITE_OTHER): Payer: BLUE CROSS/BLUE SHIELD

## 2019-09-20 DIAGNOSIS — J309 Allergic rhinitis, unspecified: Secondary | ICD-10-CM | POA: Diagnosis not present

## 2019-10-04 ENCOUNTER — Ambulatory Visit (INDEPENDENT_AMBULATORY_CARE_PROVIDER_SITE_OTHER): Payer: BLUE CROSS/BLUE SHIELD

## 2019-10-04 ENCOUNTER — Other Ambulatory Visit: Payer: Self-pay

## 2019-10-04 DIAGNOSIS — J309 Allergic rhinitis, unspecified: Secondary | ICD-10-CM | POA: Diagnosis not present

## 2019-10-11 ENCOUNTER — Ambulatory Visit (INDEPENDENT_AMBULATORY_CARE_PROVIDER_SITE_OTHER): Payer: BLUE CROSS/BLUE SHIELD

## 2019-10-11 ENCOUNTER — Other Ambulatory Visit: Payer: Self-pay

## 2019-10-11 DIAGNOSIS — J309 Allergic rhinitis, unspecified: Secondary | ICD-10-CM

## 2019-10-18 ENCOUNTER — Ambulatory Visit: Payer: BLUE CROSS/BLUE SHIELD

## 2019-10-18 ENCOUNTER — Encounter: Payer: Self-pay | Admitting: Allergy and Immunology

## 2019-10-18 ENCOUNTER — Ambulatory Visit: Payer: BLUE CROSS/BLUE SHIELD | Admitting: Allergy and Immunology

## 2019-10-18 ENCOUNTER — Other Ambulatory Visit: Payer: Self-pay

## 2019-10-18 VITALS — BP 118/82 | HR 93 | Temp 98.5°F | Resp 12 | Ht 67.0 in | Wt 283.6 lb

## 2019-10-18 DIAGNOSIS — J3089 Other allergic rhinitis: Secondary | ICD-10-CM

## 2019-10-18 DIAGNOSIS — J4520 Mild intermittent asthma, uncomplicated: Secondary | ICD-10-CM

## 2019-10-18 DIAGNOSIS — T7800XD Anaphylactic reaction due to unspecified food, subsequent encounter: Secondary | ICD-10-CM

## 2019-10-18 DIAGNOSIS — J302 Other seasonal allergic rhinitis: Secondary | ICD-10-CM

## 2019-10-18 DIAGNOSIS — J309 Allergic rhinitis, unspecified: Secondary | ICD-10-CM

## 2019-10-18 DIAGNOSIS — H1013 Acute atopic conjunctivitis, bilateral: Secondary | ICD-10-CM

## 2019-10-18 MED ORDER — EPINEPHRINE 0.3 MG/0.3ML IJ SOAJ: 0.3000 mg | INTRAMUSCULAR | 0 refills | Status: DC | PRN

## 2019-10-18 MED ORDER — ALBUTEROL SULFATE (2.5 MG/3ML) 0.083% IN NEBU: 2.5000 mg | INHALATION_SOLUTION | RESPIRATORY_TRACT | 1 refills | Status: DC | PRN

## 2019-10-18 MED ORDER — ALBUTEROL SULFATE HFA 108 (90 BASE) MCG/ACT IN AERS: 2.0000 | INHALATION_SPRAY | Freq: Four times a day (QID) | RESPIRATORY_TRACT | 1 refills | Status: DC | PRN

## 2019-10-18 NOTE — Assessment & Plan Note (Signed)
   Continue meticulous avoidance of shellfish, peanuts, and tree nuts and have access to epinephrine autoinjector 2 pack in case of accidental ingestion.  Food allergy action plan is in place.  A refill prescription has been provided for epinephrine 0.3 mg autoinjector (AuviQ) 2 pack along with instructions for its proper administration.

## 2019-10-18 NOTE — Progress Notes (Signed)
Follow-up Note  RE: Desiree Sullivan MRN: [id number] DOB: 01-20-1996 Date of Office Visit: 10/18/2019  Primary care provider: Patient, No Pcp Per Referring provider: No ref. provider found  History of present illness: Desiree Sullivan is a 24 y.o. female with asthma, allergic rhinoconjunctivitis, and food allergy presenting today for follow-up.  He was last seen in this clinic in August 2020. She reports that her asthma has been well controlled in the interval since her previous visit. Currently takes montelukast 10 mg daily and only requires albuterol rescue on more rare occasions, typically with upper respiratory tract infections and when she is around freshly mown lawns. She denies limitations in normal daily activities or nocturnal awakenings due to lower respiratory symptoms. She reports that her nasal allergy symptoms are doing "a lot better" while on immunotherapy injections. She still takes second-generation antihistamine daily which she rotates from brand to brand every month or so. She uses fluticasone nasal spray sporadically as needed. She reports that she has not had problems or complications with the immunotherapy injections. She has no ocular allergy complaints today. Landa is carefully avoided shellfish, peanuts, and tree nuts in the interval since her previous visit without accidental ingestion. She needs a refill for the epinephrine autoinjectors as well as other medications.  Assessment and plan: Mild intermittent asthma, uncomplicated Well-controlled, we will stepdown therapy at this time.  Discontinue montelukast (Singulair).  If lower respiratory symptoms progress in frequency and/or severity, the patient is to resume montelukast.  Continue albuterol HFA, 1 to 2 inhalations every 4-6 hours if needed.  Subjective and objective measures of pulmonary function will be followed and the treatment plan will be adjusted accordingly.  Seasonal and perennial allergic rhinitis   Continue appropriate allergen avoidance measures.  Continue weekly allergy injections per protocol.  May use Flonase 1 spray per nostril twice a day as needed.  Continue over-the-counter second-generation antihistamines with rotation every 1 to 2 months.  Nasal saline spray (i.e., Simply Saline) or nasal saline lavage (i.e., NeilMed) is recommended as needed and prior to medicated nasal sprays.  Allergic conjunctivitis Treatment plan as outlined above for allergic rhinitis.  May use olopatadine eye drops 1 in each eye twice a day as needed for itchy/watery eyes.   May use eye lubricant drops (Natural Tears) as needed.  Anaphylactic shock due to adverse food reaction  Continue meticulous avoidance of shellfish, peanuts, and tree nuts and have access to epinephrine autoinjector 2 pack in case of accidental ingestion.  Food allergy action plan is in place.  A refill prescription has been provided for epinephrine 0.3 mg autoinjector (AuviQ) 2 pack along with instructions for its proper administration.   Meds ordered this encounter  Medications  . EPINEPHrine (AUVI-Q) 0.3 mg/0.3 mL IJ SOAJ injection    Sig: Inject 0.3 mLs (0.3 mg total) into the muscle as needed for anaphylaxis.    Dispense:  1 each    Refill:  0    [phone] (M)  . albuterol (PROVENTIL) (2.5 MG/3ML) 0.083% nebulizer solution    Sig: Take 3 mLs (2.5 mg total) by nebulization every 4 (four) hours as needed for wheezing or shortness of breath.    Dispense:  75 mL    Refill:  1  . albuterol (PROAIR HFA) 108 (90 Base) MCG/ACT inhaler    Sig: Inhale 2 puffs into the lungs every 6 (six) hours as needed for wheezing or shortness of breath.    Dispense:  18 g    Refill:  1  Diagnostics: Spirometry:  Normal with an FEV1 of 4.52 L. This study was performed while the patient was asymptomatic.  Please see scanned spirometry results for details.    Physical examination: Blood pressure 118/82, pulse 93, temperature  98.5 F (36.9 C), temperature source Oral, resp. rate 12, height 5\' 7"  (1.702 m), weight 283 lb 9.6 oz (128.6 kg), SpO2 96 %.  General: Alert, interactive, in no acute distress. HEENT: TMs pearly gray, turbinates mildly edematous without discharge, post-pharynx unremarkable. Neck: Supple without lymphadenopathy. Lungs: Clear to auscultation without wheezing, rhonchi or rales. CV: Normal S1, S2 without murmurs. Skin: Warm and dry, without lesions or rashes.  The following portions of the patient's history were reviewed and updated as appropriate: allergies, current medications, past family history, past medical history, past social history, past surgical history and problem list.  Current Outpatient Medications  Medication Sig Dispense Refill  . adapalene (DIFFERIN) 0.1 % cream adapalene 0.1 % topical cream  APPLY A THIN LAYER TO THE AFFFECTED AREAS EVERY OTHER DAY AS DIRECTED    . albuterol (PROAIR HFA) 108 (90 Base) MCG/ACT inhaler Inhale 2 puffs into the lungs every 6 (six) hours as needed for wheezing or shortness of breath. 18 g 1  . albuterol (PROVENTIL) (2.5 MG/3ML) 0.083% nebulizer solution Take 3 mLs (2.5 mg total) by nebulization every 4 (four) hours as needed for wheezing or shortness of breath. 75 mL 1  . cetirizine (ZYRTEC) 10 MG tablet Take 1 tablet (10 mg total) by mouth daily. 30 tablet 5  . clindamycin (CLEOCIN T) 1 % lotion clindamycin 1 % lotion  APPLY A THIN LAYER TO THE AFFECTED AREA(S) ONCE DAILY AS DIRECTED    . desonide (DESOWEN) 0.05 % cream desonide 0.05 % topical cream  APPLY SPARINGLY TO THE AFFECTED AREA(S) ON FACE AND NECK 2 TIMES PER DAY FOR MAX OF 1 WEEK    . EPINEPHrine (AUVI-Q) 0.3 mg/0.3 mL IJ SOAJ injection Inject 0.3 mLs (0.3 mg total) into the muscle as needed for anaphylaxis. 1 each 0  . fluticasone (FLONASE) 50 MCG/ACT nasal spray Place 1 spray into both nostrils 2 (two) times daily as needed for allergies or rhinitis. (Patient taking differently: Place  1 spray into both nostrils as needed for allergies or rhinitis. ) 16 g 5  . levocetirizine (XYZAL) 5 MG tablet as needed. 30 tablet 5  . mometasone (ELOCON) 0.1 % cream Apply once a day on neck lesion for 10 days, then every other day for 10 days and stop it    . montelukast (SINGULAIR) 10 MG tablet TAKE 1 TABLET BY MOUTH AT BEDTIME 90 tablet 0  . Multiple Vitamin (MULTI-VITAMIN) tablet Take by mouth.    olopatadine (PATANOL) 0.1 % ophthalmic solution Place 1 drop into both eyes 2 (two) times daily as needed for allergies. 5 mL 5  . omeprazole (PRILOSEC) 40 MG capsule Take by mouth.    . triamcinolone cream (KENALOG) 0.1 % Apply 1-2 times daily to dry itchy areas on body. Do not apply to face, neck, or skin folds. 30 g 3   No current facility-administered medications for this visit.    Allergies  Allergen Reactions  . Peanut-Containing Drug Products Anaphylaxis  . Shellfish Allergy Anaphylaxis  . Dairy Aid [Lactase]     pain  . Yeast-Related Products Itching   Review of systems: Review of systems negative except as noted in HPI / PMHx.  Past Medical History:  Diagnosis Date  . Angio-edema   . Asthma   .  Eczema   . Urticaria     Family History  Problem Relation Age of Onset  . Allergic rhinitis Mother   . Asthma Mother   . Food Allergy Mother        wheat corn milk yeast  . Allergic rhinitis Father   . Asthma Father   . Food Allergy Father        shellfish   . Eczema Father   . Eczema Brother   . Angioedema Neg Hx     Social History   Socioeconomic History  . Marital status: Single    Spouse name: Not on file  . Number of children: Not on file  . Years of education: Not on file  . Highest education level: Not on file  Occupational History  . Not on file  Tobacco Use  . Smoking status: Never Smoker  . Smokeless tobacco: Never Used  Vaping Use  . Vaping Use: Never used  Substance and Sexual Activity  . Alcohol use: Never  . Drug use: Never  . Sexual  activity: Not on file  Other Topics Concern  . Not on file  Social History Narrative  . Not on file   Social Determinants of Health   Financial Resource Strain:   . Difficulty of Paying Living Expenses:   Food Insecurity:   . Worried About Programme researcher, broadcasting/film/video in the Last Year:   . Barista in the Last Year:   Transportation Needs:   . Freight forwarder (Medical):   Marland Kitchen Lack of Transportation (Non-Medical):   Physical Activity:   . Days of Exercise per Week:   . Minutes of Exercise per Session:   Stress:   . Feeling of Stress :   Social Connections:   . Frequency of Communication with Friends and Family:   . Frequency of Social Gatherings with Friends and Family:   . Attends Religious Services:   . Active Member of Clubs or Organizations:   . Attends Banker Meetings:   Marland Kitchen Marital Status:   Intimate Partner Violence:   . Fear of Current or Ex-Partner:   . Emotionally Abused:   Marland Kitchen Physically Abused:   . Sexually Abused:     I appreciate the opportunity to take part in Summer's care. Please do not hesitate to contact me with questions.  Sincerely,   R. Jorene Guest, MD

## 2019-10-18 NOTE — Assessment & Plan Note (Addendum)
Well-controlled, we will stepdown therapy at this time.  Discontinue montelukast (Singulair).  If lower respiratory symptoms progress in frequency and/or severity, the patient is to resume montelukast.  Continue albuterol HFA, 1 to 2 inhalations every 4-6 hours if needed.  Subjective and objective measures of pulmonary function will be followed and the treatment plan will be adjusted accordingly.

## 2019-10-18 NOTE — Assessment & Plan Note (Signed)
Treatment plan as outlined above for allergic rhinitis.  May use olopatadine eye drops 1 in each eye twice a day as needed for itchy/watery eyes.   May use eye lubricant drops (Natural Tears) as needed.

## 2019-10-18 NOTE — Assessment & Plan Note (Addendum)
   Continue appropriate allergen avoidance measures.  Continue weekly allergy injections per protocol.  May use Flonase 1 spray per nostril twice a day as needed.  Continue over-the-counter second-generation antihistamines with rotation every 1 to 2 months.  Nasal saline spray (i.e., Simply Saline) or nasal saline lavage (i.e., NeilMed) is recommended as needed and prior to medicated nasal sprays.

## 2019-10-18 NOTE — Patient Instructions (Addendum)
Mild intermittent asthma, uncomplicated Well-controlled, we will stepdown therapy at this time.  Discontinue montelukast (Singulair).  If lower respiratory symptoms progress in frequency and/or severity, the patient is to resume montelukast.  Continue albuterol HFA, 1 to 2 inhalations every 4-6 hours if needed.  Subjective and objective measures of pulmonary function will be followed and the treatment plan will be adjusted accordingly.  Seasonal and perennial allergic rhinitis  Continue appropriate allergen avoidance measures.  Continue weekly allergy injections per protocol.  May use Flonase 1 spray per nostril twice a day as needed.  Continue over-the-counter second-generation antihistamines with rotation every 1 to 2 months.  Nasal saline spray (i.e., Simply Saline) or nasal saline lavage (i.e., NeilMed) is recommended as needed and prior to medicated nasal sprays.  Allergic conjunctivitis Treatment plan as outlined above for allergic rhinitis.  May use olopatadine eye drops 1 in each eye twice a day as needed for itchy/watery eyes.   May use eye lubricant drops (Natural Tears) as needed.  Anaphylactic shock due to adverse food reaction  Continue meticulous avoidance of shellfish, peanuts, and tree nuts and have access to epinephrine autoinjector 2 pack in case of accidental ingestion.  Food allergy action plan is in place.  A refill prescription has been provided for epinephrine 0.3 mg autoinjector (AuviQ) 2 pack along with instructions for its proper administration.   Return in about 1 year (around 10/17/2020), or if symptoms worsen or fail to improve.

## 2019-10-25 ENCOUNTER — Other Ambulatory Visit: Payer: Self-pay

## 2019-10-25 ENCOUNTER — Ambulatory Visit (INDEPENDENT_AMBULATORY_CARE_PROVIDER_SITE_OTHER): Payer: BLUE CROSS/BLUE SHIELD

## 2019-10-25 DIAGNOSIS — J309 Allergic rhinitis, unspecified: Secondary | ICD-10-CM | POA: Diagnosis not present

## 2019-10-30 ENCOUNTER — Other Ambulatory Visit: Payer: Self-pay

## 2019-10-30 ENCOUNTER — Ambulatory Visit (INDEPENDENT_AMBULATORY_CARE_PROVIDER_SITE_OTHER): Payer: BLUE CROSS/BLUE SHIELD

## 2019-10-30 DIAGNOSIS — J309 Allergic rhinitis, unspecified: Secondary | ICD-10-CM

## 2019-11-12 ENCOUNTER — Ambulatory Visit: Payer: BLUE CROSS/BLUE SHIELD

## 2019-11-15 ENCOUNTER — Ambulatory Visit (INDEPENDENT_AMBULATORY_CARE_PROVIDER_SITE_OTHER): Payer: BLUE CROSS/BLUE SHIELD

## 2019-11-15 ENCOUNTER — Other Ambulatory Visit: Payer: Self-pay

## 2019-11-15 DIAGNOSIS — J309 Allergic rhinitis, unspecified: Secondary | ICD-10-CM | POA: Diagnosis not present

## 2019-11-27 ENCOUNTER — Ambulatory Visit (INDEPENDENT_AMBULATORY_CARE_PROVIDER_SITE_OTHER): Payer: BLUE CROSS/BLUE SHIELD

## 2019-11-27 ENCOUNTER — Other Ambulatory Visit: Payer: Self-pay

## 2019-11-27 DIAGNOSIS — J309 Allergic rhinitis, unspecified: Secondary | ICD-10-CM

## 2019-12-13 ENCOUNTER — Ambulatory Visit (INDEPENDENT_AMBULATORY_CARE_PROVIDER_SITE_OTHER): Payer: BLUE CROSS/BLUE SHIELD

## 2019-12-13 ENCOUNTER — Other Ambulatory Visit: Payer: Self-pay

## 2019-12-13 DIAGNOSIS — J309 Allergic rhinitis, unspecified: Secondary | ICD-10-CM | POA: Diagnosis not present

## 2019-12-25 ENCOUNTER — Ambulatory Visit (INDEPENDENT_AMBULATORY_CARE_PROVIDER_SITE_OTHER): Payer: BLUE CROSS/BLUE SHIELD

## 2019-12-25 ENCOUNTER — Other Ambulatory Visit: Payer: Self-pay

## 2019-12-25 DIAGNOSIS — J309 Allergic rhinitis, unspecified: Secondary | ICD-10-CM

## 2019-12-27 DIAGNOSIS — J301 Allergic rhinitis due to pollen: Secondary | ICD-10-CM

## 2019-12-27 NOTE — Progress Notes (Signed)
VIALS EXP 12-26-20 

## 2020-01-10 ENCOUNTER — Other Ambulatory Visit: Payer: Self-pay

## 2020-01-10 ENCOUNTER — Ambulatory Visit (INDEPENDENT_AMBULATORY_CARE_PROVIDER_SITE_OTHER): Payer: BLUE CROSS/BLUE SHIELD

## 2020-01-10 DIAGNOSIS — J309 Allergic rhinitis, unspecified: Secondary | ICD-10-CM

## 2020-01-15 ENCOUNTER — Other Ambulatory Visit: Payer: Self-pay

## 2020-01-15 ENCOUNTER — Ambulatory Visit (INDEPENDENT_AMBULATORY_CARE_PROVIDER_SITE_OTHER): Payer: BLUE CROSS/BLUE SHIELD

## 2020-01-15 DIAGNOSIS — Z23 Encounter for immunization: Secondary | ICD-10-CM

## 2020-01-22 ENCOUNTER — Ambulatory Visit: Payer: BLUE CROSS/BLUE SHIELD | Admitting: Allergy and Immunology

## 2020-01-24 ENCOUNTER — Ambulatory Visit (INDEPENDENT_AMBULATORY_CARE_PROVIDER_SITE_OTHER): Payer: BLUE CROSS/BLUE SHIELD

## 2020-01-24 ENCOUNTER — Other Ambulatory Visit: Payer: Self-pay

## 2020-01-24 DIAGNOSIS — J309 Allergic rhinitis, unspecified: Secondary | ICD-10-CM

## 2020-02-05 ENCOUNTER — Other Ambulatory Visit: Payer: Self-pay

## 2020-02-05 ENCOUNTER — Ambulatory Visit: Payer: BLUE CROSS/BLUE SHIELD

## 2020-02-12 ENCOUNTER — Other Ambulatory Visit: Payer: Self-pay

## 2020-02-12 ENCOUNTER — Ambulatory Visit (INDEPENDENT_AMBULATORY_CARE_PROVIDER_SITE_OTHER): Payer: BLUE CROSS/BLUE SHIELD

## 2020-02-12 DIAGNOSIS — J309 Allergic rhinitis, unspecified: Secondary | ICD-10-CM

## 2020-02-26 ENCOUNTER — Ambulatory Visit (INDEPENDENT_AMBULATORY_CARE_PROVIDER_SITE_OTHER): Payer: BLUE CROSS/BLUE SHIELD

## 2020-02-26 ENCOUNTER — Other Ambulatory Visit: Payer: Self-pay

## 2020-02-26 DIAGNOSIS — J309 Allergic rhinitis, unspecified: Secondary | ICD-10-CM | POA: Diagnosis not present

## 2020-03-11 ENCOUNTER — Ambulatory Visit (INDEPENDENT_AMBULATORY_CARE_PROVIDER_SITE_OTHER): Payer: BLUE CROSS/BLUE SHIELD

## 2020-03-11 DIAGNOSIS — J309 Allergic rhinitis, unspecified: Secondary | ICD-10-CM

## 2020-03-20 ENCOUNTER — Other Ambulatory Visit: Payer: Self-pay

## 2020-03-20 ENCOUNTER — Ambulatory Visit (INDEPENDENT_AMBULATORY_CARE_PROVIDER_SITE_OTHER): Payer: BLUE CROSS/BLUE SHIELD

## 2020-03-20 DIAGNOSIS — J309 Allergic rhinitis, unspecified: Secondary | ICD-10-CM | POA: Diagnosis not present

## 2020-03-27 ENCOUNTER — Other Ambulatory Visit: Payer: Self-pay

## 2020-03-27 ENCOUNTER — Ambulatory Visit (INDEPENDENT_AMBULATORY_CARE_PROVIDER_SITE_OTHER): Payer: BLUE CROSS/BLUE SHIELD

## 2020-03-27 DIAGNOSIS — J309 Allergic rhinitis, unspecified: Secondary | ICD-10-CM | POA: Diagnosis not present

## 2020-04-03 ENCOUNTER — Other Ambulatory Visit: Payer: Self-pay

## 2020-04-03 ENCOUNTER — Ambulatory Visit (INDEPENDENT_AMBULATORY_CARE_PROVIDER_SITE_OTHER): Payer: BLUE CROSS/BLUE SHIELD

## 2020-04-03 DIAGNOSIS — J309 Allergic rhinitis, unspecified: Secondary | ICD-10-CM | POA: Diagnosis not present

## 2020-04-10 ENCOUNTER — Ambulatory Visit (INDEPENDENT_AMBULATORY_CARE_PROVIDER_SITE_OTHER): Payer: BLUE CROSS/BLUE SHIELD

## 2020-04-10 ENCOUNTER — Other Ambulatory Visit: Payer: Self-pay

## 2020-04-10 DIAGNOSIS — J309 Allergic rhinitis, unspecified: Secondary | ICD-10-CM

## 2020-04-17 ENCOUNTER — Ambulatory Visit (INDEPENDENT_AMBULATORY_CARE_PROVIDER_SITE_OTHER): Payer: BLUE CROSS/BLUE SHIELD

## 2020-04-17 ENCOUNTER — Other Ambulatory Visit: Payer: Self-pay

## 2020-04-17 DIAGNOSIS — J309 Allergic rhinitis, unspecified: Secondary | ICD-10-CM | POA: Diagnosis not present

## 2020-05-15 ENCOUNTER — Ambulatory Visit: Payer: 59

## 2020-05-15 ENCOUNTER — Other Ambulatory Visit: Payer: Self-pay

## 2020-05-15 ENCOUNTER — Encounter: Payer: Self-pay | Admitting: Allergy

## 2020-05-15 ENCOUNTER — Ambulatory Visit (INDEPENDENT_AMBULATORY_CARE_PROVIDER_SITE_OTHER): Payer: BLUE CROSS/BLUE SHIELD | Admitting: Allergy

## 2020-05-15 DIAGNOSIS — T7800XD Anaphylactic reaction due to unspecified food, subsequent encounter: Secondary | ICD-10-CM | POA: Diagnosis not present

## 2020-05-15 DIAGNOSIS — H1013 Acute atopic conjunctivitis, bilateral: Secondary | ICD-10-CM

## 2020-05-15 DIAGNOSIS — J302 Other seasonal allergic rhinitis: Secondary | ICD-10-CM

## 2020-05-15 DIAGNOSIS — Z8709 Personal history of other diseases of the respiratory system: Secondary | ICD-10-CM | POA: Diagnosis not present

## 2020-05-15 DIAGNOSIS — J309 Allergic rhinitis, unspecified: Secondary | ICD-10-CM

## 2020-05-15 DIAGNOSIS — J4520 Mild intermittent asthma, uncomplicated: Secondary | ICD-10-CM

## 2020-05-15 DIAGNOSIS — J3089 Other allergic rhinitis: Secondary | ICD-10-CM

## 2020-05-15 DIAGNOSIS — H1010 Acute atopic conjunctivitis, unspecified eye: Secondary | ICD-10-CM

## 2020-05-15 MED ORDER — MONTELUKAST SODIUM 10 MG PO TABS: 10.0000 mg | ORAL_TABLET | Freq: Every day | ORAL | 5 refills | Status: DC

## 2020-05-15 NOTE — Assessment & Plan Note (Signed)
Past history - 2019 bloodwork mildly positive to peanuts, tree nuts. 2019 skin testing positive to shellfish and milk. Interim history - No accidental ingestion but had some symptoms after a coworker heated up peanut butter at work. Treated with benadryl and albuterol.   Continue to avoid your triggering foods.   For mild symptoms you can take over the counter antihistamines such as Benadryl and monitor symptoms closely. If symptoms worsen or if you have severe symptoms including breathing issues, throat closure, significant swelling, whole body hives, severe diarrhea and vomiting, lightheadedness then inject epinephrine and seek immediate medical care afterwards.  Action plan in place.

## 2020-05-15 NOTE — Assessment & Plan Note (Signed)
Past history - patient started allergy injections in [place]. 2019 bloodwork positive to dust mites, cat, dog, grasses, cockroach, trees, and weeds.  Interim history - tolerating injections with no issues. Noticed increased sinus symptoms the last 2 days, no fevers.   Symptoms most likely due to increased tree pollen in the air and no indication for antibiotics at this time.   May use over the counter antihistamines such as Zyrtec (cetirizine), Claritin (loratadine), Allegra (fexofenadine), or Xyzal (levocetirizine) twice a day for the next few days.   Take an over the counter decongestant for the next few days as well. Restart Singulair (montelukast) 10mg  daily at night. Use Flonase 1 spray per nostril twice a day. Use a saline spray beforehand to clean out sinuses.  If symptoms worsen by Monday - send me a mychart message.  Continue weekly allergy injections.  Continue environmental control measures.   May use olopatadine eye drops 1 in each eye twice a day as needed for itchy/watery eyes.

## 2020-05-15 NOTE — Assessment & Plan Note (Signed)
Stable. . Daily controller medication(s): None. . Prior to physical activity: May use albuterol rescue inhaler 2 puffs 5 to 15 minutes prior to strenuous physical activities. Marland Kitchen Rescue medications: May use albuterol rescue inhaler 2 puffs or nebulizer every 4 to 6 hours as needed for shortness of breath, chest tightness, coughing, and wheezing. Monitor frequency of use.

## 2020-05-15 NOTE — Progress Notes (Signed)
RE: Desiree Sullivan MRN: [id number] DOB: [birth year] Date of Telemedicine Visit: 05/15/2020  Referring provider: No ref. provider found Primary care provider: Patient, No Pcp Per  Chief Complaint: Sinusitis (Pt believes she has a sinus infection symptoms started 2 days ago ear drainage and nasal congestion and head congestion with yellow when blowing nose)   Telemedicine Follow Up Visit via Telephone: I connected with Desiree Sullivan for a follow up on 05/15/20 by telephone and verified that I am speaking with the correct person using two identifiers.   I discussed the limitations, risks, security and privacy concerns of performing an evaluation and management service by telephone and the availability of in person appointments. I also discussed with the patient that there may be a patient responsible charge related to this service. The patient expressed understanding and agreed to proceed.  Patient is at home/work. Provider is at the office.  Visit start time: 3:12PM Visit end time: 3:25PM Insurance consent/check in by: front desk Medical consent and medical assistant/nurse: Reece Levy.  History of Present Illness: She is a 25 y.o. female, who is being followed for asthma, allergic rhino conjunctivitis on AIT, food allergy. Her previous allergy office visit was on 10/18/2019 with Dr. Nunzio Cobbs. Today is a new complaint visit of sinus symptoms.  Sinus About 2 days ago started to have some nasal congestion with yellow drainage, ear fullness.  No fevers or chills. No sick contacts.  Taking zyrtec 10mg  daily, Flonase 1 spray twice a day, eye drops prn.   Allergy injections going well with no issues. Noted some improvement.  Last infections/antibiotics use was last year.  No allergies to antibiotics.   Mild intermittent asthma Some coughing and no albuterol use.   Anaphylactic shock due to adverse food reaction Currently avoiding shellfish, peanuts, and tree nuts.  Patient took benadryl  and albuterol at work after a coworker warmed up a peanut butter cookie at work.   History of frequent upper respiratory infection Got pneumovax but did not get follow up labs.  Assessment and Plan: Desiree is a 25 y.o. female with: Seasonal and perennial allergic rhinoconjunctivitis Past history - patient [place] allergy injections in [place]. 2019 bloodwork positive to dust mites, cat, dog, grasses, cockroach, trees, and weeds.  Interim history - tolerating injections with no issues. Noticed increased sinus symptoms the last 2 days, no fevers.   Symptoms most likely due to increased tree pollen in the air and no indication for antibiotics at this time.   May use over the counter antihistamines such as Zyrtec (cetirizine), Claritin (loratadine), Allegra (fexofenadine), or Xyzal (levocetirizine) twice a day for the next few days.   Take an over the counter decongestant for the next few days as well. Restart Singulair (montelukast) 10mg  daily at night. Use Flonase 1 spray per nostril twice a day. Use a saline spray beforehand to clean out sinuses.  If symptoms worsen by Monday - send me a mychart message.  Continue weekly allergy injections.  Continue environmental control measures.   May use olopatadine eye drops 1 in each eye twice a day as needed for itchy/watery eyes.   Mild intermittent asthma, uncomplicated Stable. . Daily controller medication(s): None. . Prior to physical activity: May use albuterol rescue inhaler 2 puffs 5 to 15 minutes prior to strenuous physical activities. Rescue medications: May use albuterol rescue inhaler 2 puffs or nebulizer every 4 to 6 hours as needed for shortness of breath, chest tightness, coughing, and wheezing. Monitor frequency of use.  Anaphylactic shock due to adverse food reaction Past history - 2019 bloodwork mildly positive to peanuts, tree nuts. 2019 skin testing positive to shellfish and milk. Interim history - No accidental  ingestion but had some symptoms after a coworker heated up peanut butter at work. Treated with benadryl and albuterol.   Continue to avoid your triggering foods.   For mild symptoms you can take over the counter antihistamines such as Benadryl and monitor symptoms closely. If symptoms worsen or if you have severe symptoms including breathing issues, throat closure, significant swelling, whole body hives, severe diarrhea and vomiting, lightheadedness then inject epinephrine and seek immediate medical care afterwards.  Action plan in place.   History of frequent upper respiratory infection Past history - low pneumococcal titers. Interim history - got pneumovax but no repeat titers. Last infection was last year.   Keep track of infections.  Get bloodwork for the pneumococcal titers.   Return in about 4 months (around 09/14/2020).  Meds ordered this encounter  Medications  . montelukast (SINGULAIR) 10 MG tablet    Sig: Take 1 tablet (10 mg total) by mouth at bedtime.    Dispense:  30 tablet    Refill:  5    Lab Orders     IgG, IgA, IgM     Strep pneumoniae 23 Serotypes IgG  Diagnostics: None.  Medication List:  Current Outpatient Medications  Medication Sig Dispense Refill  . albuterol (PROAIR HFA) 108 (90 Base) MCG/ACT inhaler Inhale 2 puffs into the lungs every 6 (six) hours as needed for wheezing or shortness of breath. 18 g 1  . albuterol (PROVENTIL) (2.5 MG/3ML) 0.083% nebulizer solution Take 3 mLs (2.5 mg total) by nebulization every 4 (four) hours as needed for wheezing or shortness of breath. 75 mL 1  . EPINEPHrine (AUVI-Q) 0.3 mg/0.3 mL IJ SOAJ injection Inject 0.3 mLs (0.3 mg total) into the muscle as needed for anaphylaxis. 1 each 0  . fluticasone (FLONASE) 50 MCG/ACT nasal spray Place 1 spray into both nostrils 2 (two) times daily as needed for allergies or rhinitis. (Patient taking differently: Place 1 spray into both nostrils as needed for allergies or rhinitis.) 16 g 5   . montelukast (SINGULAIR) 10 MG tablet Take 1 tablet (10 mg total) by mouth at bedtime. 30 tablet 5  . Multiple Vitamin (MULTI-VITAMIN) tablet Take by mouth.    Marland Kitchen olopatadine (PATANOL) 0.1 % ophthalmic solution Place 1 drop into both eyes 2 (two) times daily as needed for allergies. 5 mL 5  . omeprazole (PRILOSEC) 40 MG capsule Take by mouth.    Marland Kitchen adapalene (DIFFERIN) 0.1 % cream adapalene 0.1 % topical cream  APPLY A THIN LAYER TO THE AFFFECTED AREAS EVERY OTHER DAY AS DIRECTED (Patient not taking: Reported on 05/15/2020)    . cetirizine (ZYRTEC) 10 MG tablet Take 1 tablet (10 mg total) by mouth daily. (Patient not taking: Reported on 05/15/2020) 30 tablet 5  . desonide (DESOWEN) 0.05 % cream desonide 0.05 % topical cream  APPLY SPARINGLY TO THE AFFECTED AREA(S) ON FACE AND NECK 2 TIMES PER DAY FOR MAX OF 1 WEEK (Patient not taking: Reported on 05/15/2020)    . levocetirizine (XYZAL) 5 MG tablet as needed. (Patient not taking: Reported on 05/15/2020) 30 tablet 5  . mometasone (ELOCON) 0.1 % cream Apply once a day on neck lesion for 10 days, then every other day for 10 days and stop it (Patient not taking: Reported on 05/15/2020)    . triamcinolone cream (KENALOG)  0.1 % Apply 1-2 times daily to dry itchy areas on body. Do not apply to face, neck, or skin folds. (Patient not taking: Reported on 05/15/2020) 30 g 3   No current facility-administered medications for this visit.   Allergies: Allergies  Allergen Reactions  . Peanut-Containing Drug Products Anaphylaxis  . Shellfish Allergy Anaphylaxis  . Dairy Aid [Lactase]     pain  . Yeast-Related Products Itching   I reviewed her past medical history, social history, family history, and environmental history and no significant changes have been reported from her previous visit.  Review of Systems  Constitutional: Negative for appetite change, chills, fever and unexpected weight change.  HENT: Positive for congestion and rhinorrhea.   Eyes: Negative  for itching.  Respiratory: Negative for cough, chest tightness, shortness of breath and wheezing.   Gastrointestinal: Negative for abdominal pain.  Skin: Negative for rash.  Allergic/Immunologic: Positive for environmental allergies and food allergies.  Neurological: Negative for headaches.   Objective: Physical Exam Not obtained as encounter was done via telephone.   Previous notes and tests were reviewed.  I discussed the assessment and treatment plan with the patient. The patient was provided an opportunity to ask questions and all were answered. The patient agreed with the plan and demonstrated an understanding of the instructions. After visit summary/patient instructions available via mychart.   The patient was advised to call back or seek an in-person evaluation if the symptoms worsen or if the condition fails to improve as anticipated.  I provided 13 minutes of non-face-to-face time during this encounter.  It was my pleasure to participate in [place] Sullivan's care today. Please feel free to contact me with any questions or concerns.   Sincerely,  Wyline Mood, DO Allergy & Immunology  [facility] office: [phone] [facility] office: [phone]

## 2020-05-15 NOTE — Patient Instructions (Addendum)
Sinus   You most likely have a flare of your allergies as tree pollen has increased significantly the last 1-2 weeks.  You do not need antibiotics at this time as you only had symptoms for 2 days with no fevers/chills.  Recommendations:  May use over the counter antihistamines such as Zyrtec (cetirizine), Claritin (loratadine), Allegra (fexofenadine), or Xyzal (levocetirizine) twice a day for the next few days.   Take an over the counter decongestant for the next few days as well. Restart Singulair (montelukast) 10mg  daily at night. Use Flonase 1 spray per nostril twice a day. Use a saline spray beforehand to clean out sinuses.  If symptoms worsen by Monday - send me a mychart message.  Seasonal and perennial allergic rhinitis (trees, weeds, grasses, cat, dog, molds, and cockroach)  Continue weekly allergy injections.  Continue environmental control measures.   May use over the counter antihistamines such as Zyrtec (cetirizine), Claritin (loratadine), Allegra (fexofenadine), or Xyzal (levocetirizine) daily as needed.  May take twice a day for breakthrough symptoms.   May use Flonase 1 spray per nostril twice a day as needed.  Nasal saline spray (i.e., Simply Saline) or nasal saline lavage (i.e., NeilMed) is recommended as needed and prior to medicated nasal sprays.  May use olopatadine eye drops 1 in each eye twice a day as needed for itchy/watery eyes.   Mild intermittent asthma, uncomplicated . Daily controller medication(s): None. . Prior to physical activity: May use albuterol rescue inhaler 2 puffs 5 to 15 minutes prior to strenuous physical activities. Sunday Rescue medications: May use albuterol rescue inhaler 2 puffs or nebulizer every 4 to 6 hours as needed for shortness of breath, chest tightness, coughing, and wheezing. Monitor frequency of use.  . Asthma control goals:  o Full participation in all desired activities (may need albuterol before activity) o Albuterol use  two times or less a week on average (not counting use with activity) o Cough interfering with sleep two times or less a month o Oral steroids no more than once a year o No hospitalizations  Adverse food reaction   Continue to avoid your triggering foods.   For mild symptoms you can take over the counter antihistamines such as Benadryl and monitor symptoms closely. If symptoms worsen or if you have severe symptoms including breathing issues, throat closure, significant swelling, whole body hives, severe diarrhea and vomiting, lightheadedness then inject epinephrine and seek immediate medical care afterwards.  4. Infections  Keep track of infections.  Get bloodwork for the pneumococcal titers - orders mailed home.   Follow up in 4 months or sooner if needed.

## 2020-05-15 NOTE — Assessment & Plan Note (Signed)
Past history - low pneumococcal titers. Interim history - got pneumovax but no repeat titers. Last infection was last year.   Keep track of infections.  Get bloodwork for the pneumococcal titers.

## 2020-06-05 ENCOUNTER — Ambulatory Visit (INDEPENDENT_AMBULATORY_CARE_PROVIDER_SITE_OTHER): Payer: BLUE CROSS/BLUE SHIELD

## 2020-06-05 ENCOUNTER — Other Ambulatory Visit: Payer: Self-pay

## 2020-06-05 DIAGNOSIS — J309 Allergic rhinitis, unspecified: Secondary | ICD-10-CM

## 2020-06-10 DIAGNOSIS — J301 Allergic rhinitis due to pollen: Secondary | ICD-10-CM | POA: Diagnosis not present

## 2020-06-10 NOTE — Progress Notes (Signed)
Vials exp 06-10-21 

## 2020-06-26 ENCOUNTER — Other Ambulatory Visit: Payer: Self-pay

## 2020-06-26 ENCOUNTER — Ambulatory Visit (INDEPENDENT_AMBULATORY_CARE_PROVIDER_SITE_OTHER): Payer: BLUE CROSS/BLUE SHIELD

## 2020-06-26 DIAGNOSIS — J309 Allergic rhinitis, unspecified: Secondary | ICD-10-CM

## 2020-07-01 LAB — STREP PNEUMONIAE 23 SEROTYPES IGG
Pneumo Ab Type 1*: >15.2 ug/mL (ref >1.3)
Pneumo Ab Type 12 (12F)*: 2.6 ug/mL (ref >1.3)
Pneumo Ab Type 14*: >30.6 ug/mL (ref >1.3)
Pneumo Ab Type 17 (17F)*: 6.7 ug/mL (ref >1.3)
Pneumo Ab Type 19 (19F)*: 29.7 ug/mL (ref >1.3)
Pneumo Ab Type 2*: 6.9 ug/mL (ref >1.3)
Pneumo Ab Type 20*: 9 ug/mL (ref >1.3)
Pneumo Ab Type 22 (22F)*: 3.5 ug/mL (ref >1.3)
Pneumo Ab Type 23 (23F)*: 3.5 ug/mL (ref >1.3)
Pneumo Ab Type 26 (6B)*: 2.7 ug/mL (ref >1.3)
Pneumo Ab Type 3*: 14.6 ug/mL (ref >1.3)
Pneumo Ab Type 34 (10A)*: 15.3 ug/mL (ref >1.3)
Pneumo Ab Type 4*: 5.4 ug/mL (ref >1.3)
Pneumo Ab Type 43 (11A)*: 3.6 ug/mL (ref >1.3)
Pneumo Ab Type 5*: 2.8 ug/mL (ref >1.3)
Pneumo Ab Type 51 (7F)*: 6.8 ug/mL (ref >1.3)
Pneumo Ab Type 54 (15B)*: 3.2 ug/mL (ref >1.3)
Pneumo Ab Type 56 (18C)*: 9.4 ug/mL (ref >1.3)
Pneumo Ab Type 57 (19A)*: 13.5 ug/mL (ref >1.3)
Pneumo Ab Type 68 (9V)*: 16.4 ug/mL (ref >1.3)
Pneumo Ab Type 70 (33F)*: 4.9 ug/mL (ref >1.3)
Pneumo Ab Type 8*: 8.1 ug/mL (ref >1.3)
Pneumo Ab Type 9 (9N)*: 3 ug/mL (ref >1.3)

## 2020-07-01 LAB — IGG, IGA, IGM
IgA/Immunoglobulin A, Serum: 297 mg/dL (ref 87–352)
IgG (Immunoglobin G), Serum: 1441 mg/dL (ref 586–1602)
IgM (Immunoglobulin M), Srm: 73 mg/dL (ref 26–217)

## 2020-07-22 ENCOUNTER — Other Ambulatory Visit: Payer: Self-pay

## 2020-07-22 ENCOUNTER — Ambulatory Visit (INDEPENDENT_AMBULATORY_CARE_PROVIDER_SITE_OTHER): Payer: BLUE CROSS/BLUE SHIELD

## 2020-07-22 DIAGNOSIS — J309 Allergic rhinitis, unspecified: Secondary | ICD-10-CM | POA: Diagnosis not present

## 2020-08-14 ENCOUNTER — Other Ambulatory Visit: Payer: Self-pay

## 2020-08-14 ENCOUNTER — Ambulatory Visit (INDEPENDENT_AMBULATORY_CARE_PROVIDER_SITE_OTHER): Payer: BLUE CROSS/BLUE SHIELD

## 2020-08-14 DIAGNOSIS — J309 Allergic rhinitis, unspecified: Secondary | ICD-10-CM

## 2020-08-28 ENCOUNTER — Other Ambulatory Visit: Payer: Self-pay

## 2020-08-28 ENCOUNTER — Ambulatory Visit (INDEPENDENT_AMBULATORY_CARE_PROVIDER_SITE_OTHER): Payer: BLUE CROSS/BLUE SHIELD

## 2020-08-28 DIAGNOSIS — J309 Allergic rhinitis, unspecified: Secondary | ICD-10-CM | POA: Diagnosis not present

## 2020-09-11 ENCOUNTER — Ambulatory Visit (INDEPENDENT_AMBULATORY_CARE_PROVIDER_SITE_OTHER): Payer: BLUE CROSS/BLUE SHIELD

## 2020-09-11 ENCOUNTER — Other Ambulatory Visit: Payer: Self-pay

## 2020-09-11 DIAGNOSIS — J309 Allergic rhinitis, unspecified: Secondary | ICD-10-CM | POA: Diagnosis not present

## 2020-09-18 ENCOUNTER — Ambulatory Visit (INDEPENDENT_AMBULATORY_CARE_PROVIDER_SITE_OTHER): Payer: BLUE CROSS/BLUE SHIELD

## 2020-09-18 ENCOUNTER — Other Ambulatory Visit: Payer: Self-pay

## 2020-09-18 DIAGNOSIS — J309 Allergic rhinitis, unspecified: Secondary | ICD-10-CM | POA: Diagnosis not present

## 2020-09-30 ENCOUNTER — Other Ambulatory Visit: Payer: Self-pay

## 2020-09-30 ENCOUNTER — Ambulatory Visit (INDEPENDENT_AMBULATORY_CARE_PROVIDER_SITE_OTHER): Payer: BLUE CROSS/BLUE SHIELD

## 2020-09-30 DIAGNOSIS — J309 Allergic rhinitis, unspecified: Secondary | ICD-10-CM

## 2020-10-09 ENCOUNTER — Ambulatory Visit (INDEPENDENT_AMBULATORY_CARE_PROVIDER_SITE_OTHER): Payer: BLUE CROSS/BLUE SHIELD

## 2020-10-09 DIAGNOSIS — J309 Allergic rhinitis, unspecified: Secondary | ICD-10-CM

## 2020-10-16 ENCOUNTER — Ambulatory Visit (INDEPENDENT_AMBULATORY_CARE_PROVIDER_SITE_OTHER): Payer: BLUE CROSS/BLUE SHIELD

## 2020-10-16 ENCOUNTER — Other Ambulatory Visit: Payer: Self-pay

## 2020-10-16 DIAGNOSIS — J309 Allergic rhinitis, unspecified: Secondary | ICD-10-CM

## 2020-10-28 ENCOUNTER — Ambulatory Visit (INDEPENDENT_AMBULATORY_CARE_PROVIDER_SITE_OTHER): Payer: BLUE CROSS/BLUE SHIELD

## 2020-10-28 ENCOUNTER — Other Ambulatory Visit: Payer: Self-pay

## 2020-10-28 DIAGNOSIS — J309 Allergic rhinitis, unspecified: Secondary | ICD-10-CM | POA: Diagnosis not present

## 2020-11-10 ENCOUNTER — Other Ambulatory Visit: Payer: Self-pay | Admitting: Allergy

## 2020-11-12 DIAGNOSIS — J301 Allergic rhinitis due to pollen: Secondary | ICD-10-CM | POA: Diagnosis not present

## 2020-11-12 NOTE — Progress Notes (Signed)
VIALS MADE. EXP 11-12-21 

## 2020-11-18 ENCOUNTER — Other Ambulatory Visit: Payer: Self-pay

## 2020-11-18 ENCOUNTER — Ambulatory Visit (INDEPENDENT_AMBULATORY_CARE_PROVIDER_SITE_OTHER): Payer: BLUE CROSS/BLUE SHIELD

## 2020-11-18 DIAGNOSIS — J309 Allergic rhinitis, unspecified: Secondary | ICD-10-CM | POA: Diagnosis not present

## 2020-12-02 ENCOUNTER — Other Ambulatory Visit: Payer: Self-pay

## 2020-12-02 ENCOUNTER — Ambulatory Visit (INDEPENDENT_AMBULATORY_CARE_PROVIDER_SITE_OTHER): Payer: BLUE CROSS/BLUE SHIELD

## 2020-12-02 DIAGNOSIS — J309 Allergic rhinitis, unspecified: Secondary | ICD-10-CM | POA: Diagnosis not present

## 2020-12-23 ENCOUNTER — Other Ambulatory Visit: Payer: Self-pay

## 2020-12-23 ENCOUNTER — Ambulatory Visit (INDEPENDENT_AMBULATORY_CARE_PROVIDER_SITE_OTHER): Payer: BLUE CROSS/BLUE SHIELD

## 2020-12-23 DIAGNOSIS — J309 Allergic rhinitis, unspecified: Secondary | ICD-10-CM

## 2021-02-10 ENCOUNTER — Other Ambulatory Visit: Payer: Self-pay

## 2021-02-10 ENCOUNTER — Ambulatory Visit (INDEPENDENT_AMBULATORY_CARE_PROVIDER_SITE_OTHER): Payer: BLUE CROSS/BLUE SHIELD

## 2021-02-10 DIAGNOSIS — J309 Allergic rhinitis, unspecified: Secondary | ICD-10-CM

## 2021-02-17 ENCOUNTER — Other Ambulatory Visit: Payer: Self-pay

## 2021-02-17 ENCOUNTER — Ambulatory Visit (INDEPENDENT_AMBULATORY_CARE_PROVIDER_SITE_OTHER): Payer: BLUE CROSS/BLUE SHIELD

## 2021-02-17 DIAGNOSIS — J309 Allergic rhinitis, unspecified: Secondary | ICD-10-CM

## 2021-02-21 IMAGING — DX DG SHOULDER 2+V*L*
3 series · 3 of 3 positions shown · non-contrast
Comparison: None.

CLINICAL DATA: Possible subluxation, recurrent shoulder
dislocations

EXAM:
LEFT SHOULDER - 2+ VIEW

[shoulder grashey]
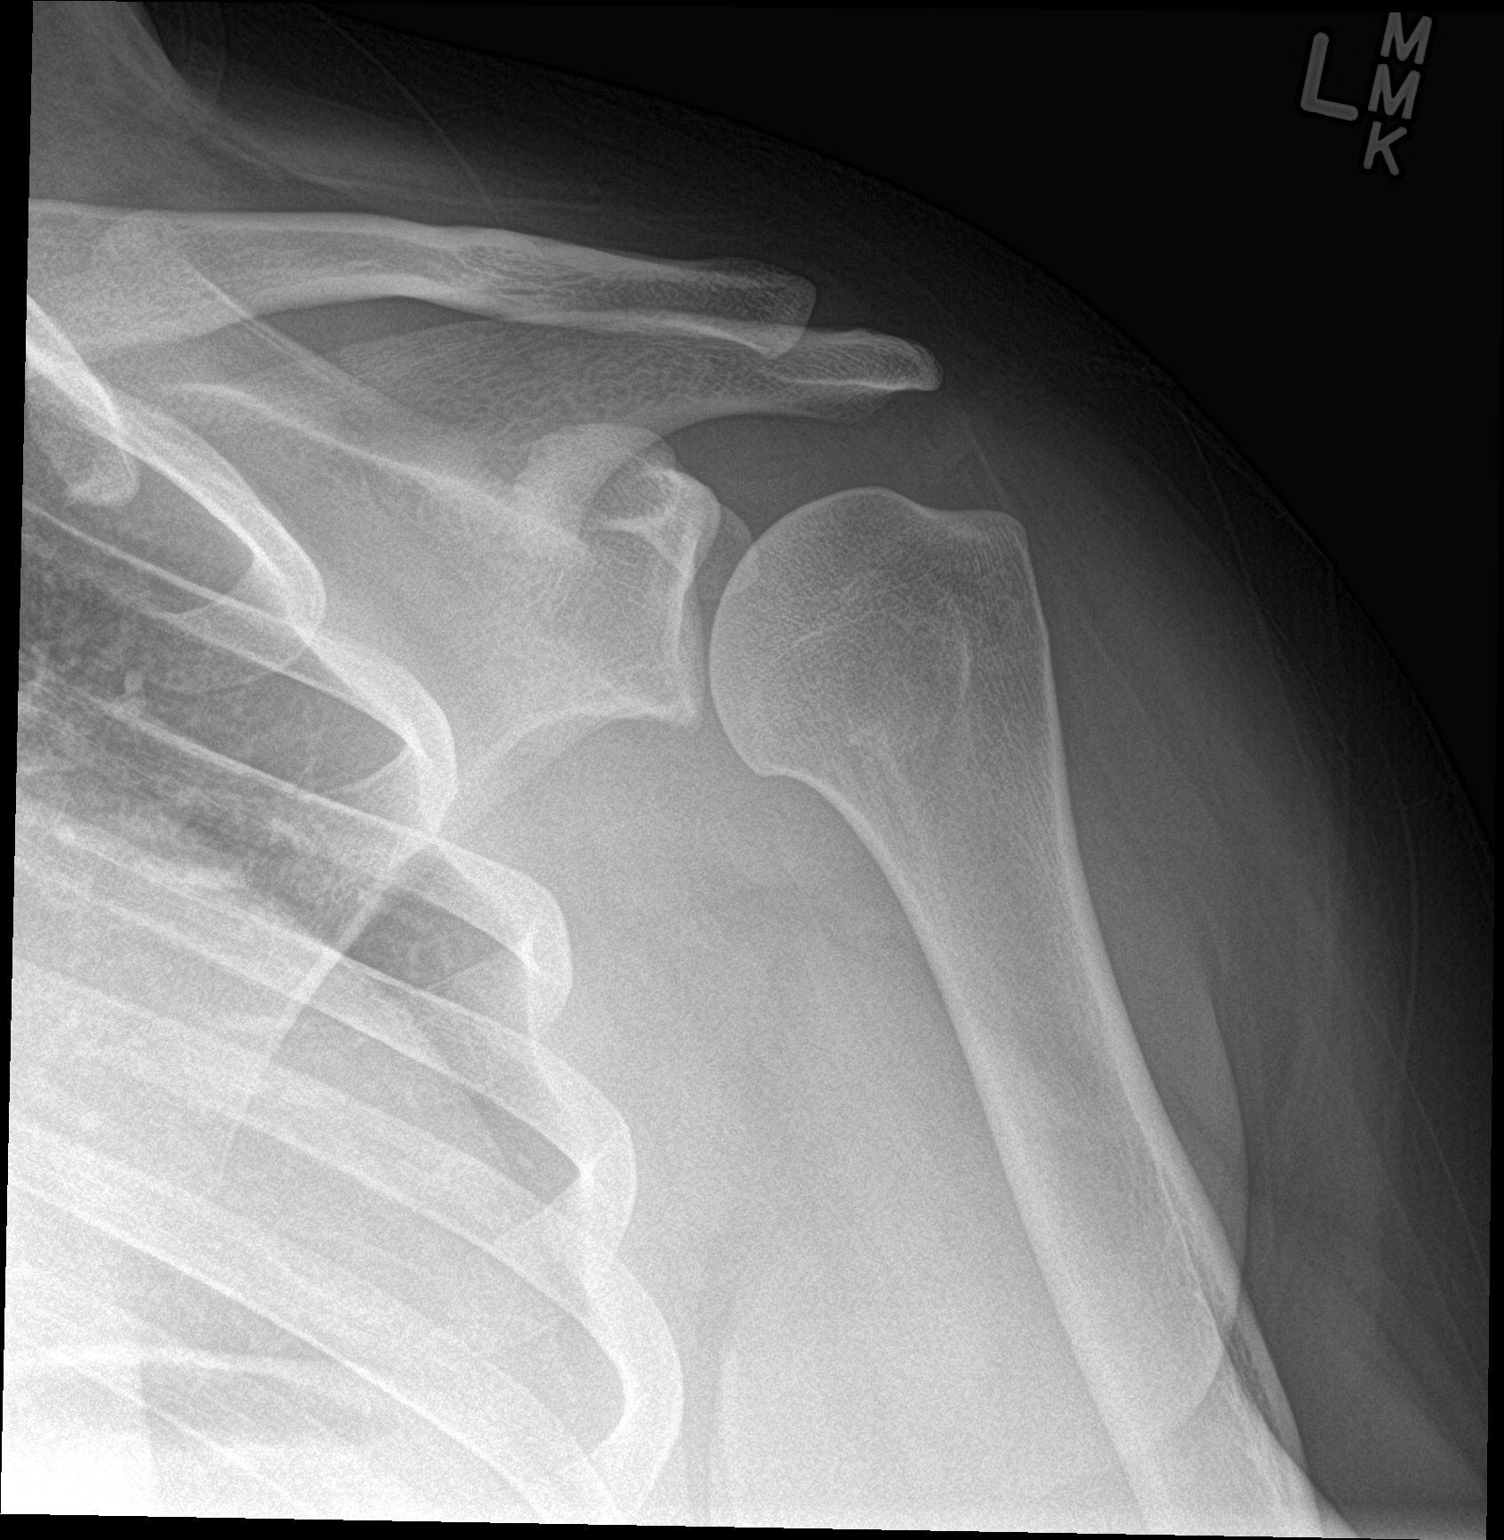

[shoulder y view]
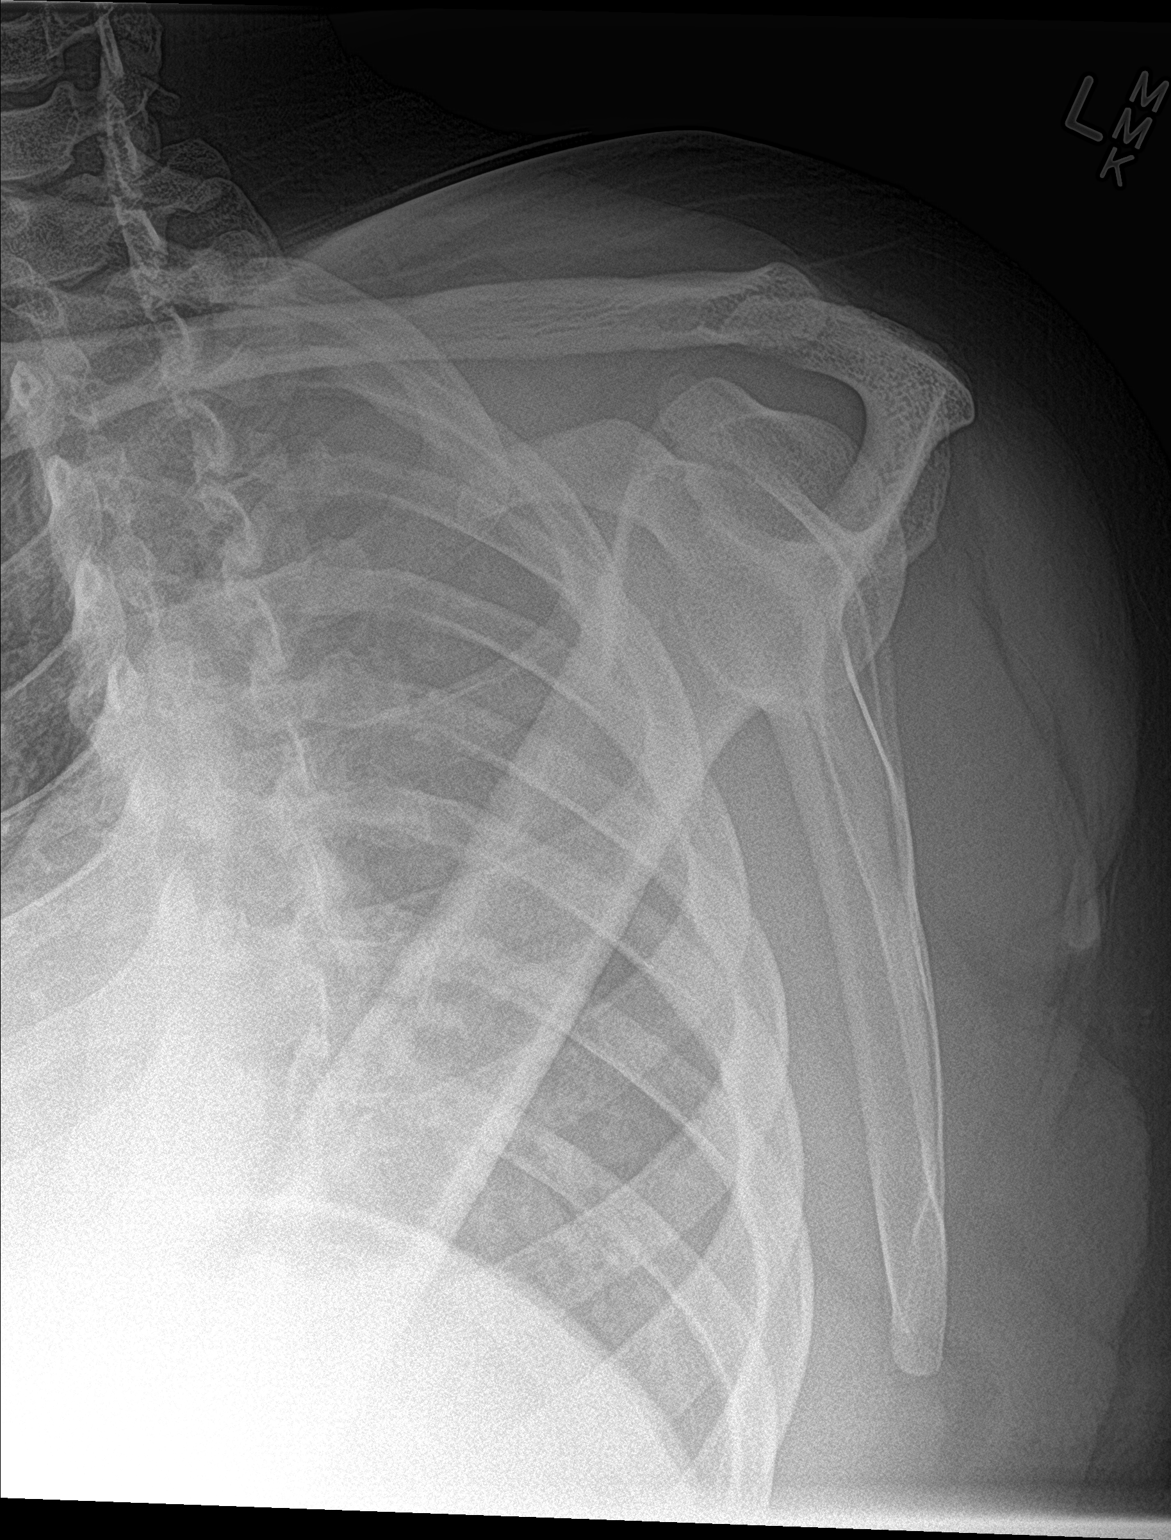

[shoulder axillary]
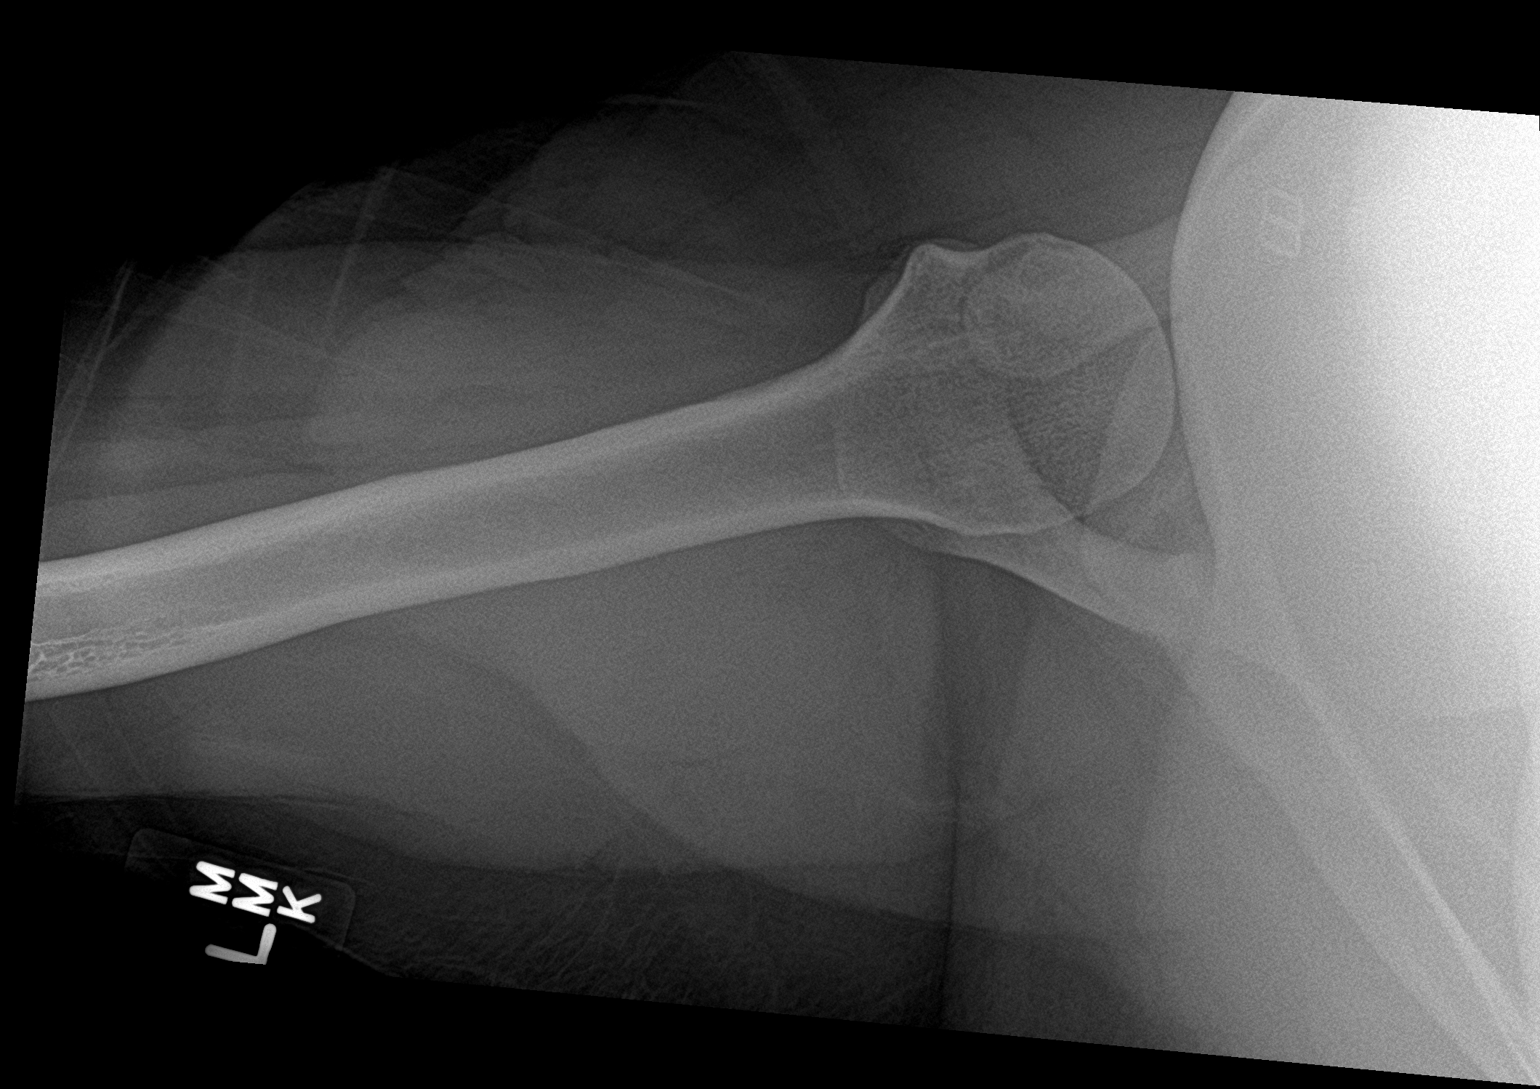

[3 of 3 positions shown; findings below may reference images not displayed]

FINDINGS: No fracture or dislocation of the left shoulder. Suspect a small
glenohumeral joint effusion. Joint spaces are well preserved. The
partially imaged left chest is unremarkable.
IMPRESSION: No fracture or dislocation of the left shoulder. The glenohumeral
joint is in anatomic apposition and well preserved. Suspect a small
glenohumeral joint effusion.

## 2021-02-24 ENCOUNTER — Other Ambulatory Visit: Payer: Self-pay

## 2021-02-24 ENCOUNTER — Ambulatory Visit (INDEPENDENT_AMBULATORY_CARE_PROVIDER_SITE_OTHER): Payer: BLUE CROSS/BLUE SHIELD

## 2021-02-24 DIAGNOSIS — J309 Allergic rhinitis, unspecified: Secondary | ICD-10-CM

## 2021-03-03 ENCOUNTER — Ambulatory Visit (INDEPENDENT_AMBULATORY_CARE_PROVIDER_SITE_OTHER): Payer: BLUE CROSS/BLUE SHIELD

## 2021-03-03 ENCOUNTER — Other Ambulatory Visit: Payer: Self-pay

## 2021-03-03 DIAGNOSIS — J309 Allergic rhinitis, unspecified: Secondary | ICD-10-CM

## 2021-03-12 ENCOUNTER — Ambulatory Visit (INDEPENDENT_AMBULATORY_CARE_PROVIDER_SITE_OTHER): Payer: BLUE CROSS/BLUE SHIELD

## 2021-03-12 ENCOUNTER — Other Ambulatory Visit: Payer: Self-pay

## 2021-03-12 DIAGNOSIS — J309 Allergic rhinitis, unspecified: Secondary | ICD-10-CM

## 2021-03-19 ENCOUNTER — Other Ambulatory Visit: Payer: Self-pay

## 2021-03-19 ENCOUNTER — Ambulatory Visit (INDEPENDENT_AMBULATORY_CARE_PROVIDER_SITE_OTHER): Payer: BLUE CROSS/BLUE SHIELD

## 2021-03-19 DIAGNOSIS — J309 Allergic rhinitis, unspecified: Secondary | ICD-10-CM | POA: Diagnosis not present

## 2021-03-24 ENCOUNTER — Ambulatory Visit: Payer: BLUE CROSS/BLUE SHIELD

## 2021-03-24 ENCOUNTER — Other Ambulatory Visit: Payer: Self-pay

## 2021-04-14 ENCOUNTER — Ambulatory Visit (INDEPENDENT_AMBULATORY_CARE_PROVIDER_SITE_OTHER): Payer: BLUE CROSS/BLUE SHIELD

## 2021-04-14 ENCOUNTER — Other Ambulatory Visit: Payer: Self-pay

## 2021-04-14 DIAGNOSIS — J309 Allergic rhinitis, unspecified: Secondary | ICD-10-CM | POA: Diagnosis not present

## 2021-05-07 ENCOUNTER — Other Ambulatory Visit: Payer: Self-pay

## 2021-05-07 ENCOUNTER — Ambulatory Visit (INDEPENDENT_AMBULATORY_CARE_PROVIDER_SITE_OTHER): Payer: BLUE CROSS/BLUE SHIELD

## 2021-05-07 DIAGNOSIS — J309 Allergic rhinitis, unspecified: Secondary | ICD-10-CM | POA: Diagnosis not present

## 2021-06-11 ENCOUNTER — Ambulatory Visit (INDEPENDENT_AMBULATORY_CARE_PROVIDER_SITE_OTHER): Payer: BLUE CROSS/BLUE SHIELD

## 2021-06-11 DIAGNOSIS — J309 Allergic rhinitis, unspecified: Secondary | ICD-10-CM | POA: Diagnosis not present

## 2021-06-26 DIAGNOSIS — J301 Allergic rhinitis due to pollen: Secondary | ICD-10-CM | POA: Diagnosis not present

## 2021-06-26 NOTE — Progress Notes (Signed)
EXP 06/27/22 ?

## 2021-07-09 ENCOUNTER — Ambulatory Visit (INDEPENDENT_AMBULATORY_CARE_PROVIDER_SITE_OTHER): Payer: BLUE CROSS/BLUE SHIELD

## 2021-07-09 DIAGNOSIS — J309 Allergic rhinitis, unspecified: Secondary | ICD-10-CM | POA: Diagnosis not present

## 2021-08-06 ENCOUNTER — Ambulatory Visit (INDEPENDENT_AMBULATORY_CARE_PROVIDER_SITE_OTHER): Payer: BLUE CROSS/BLUE SHIELD

## 2021-08-06 DIAGNOSIS — J309 Allergic rhinitis, unspecified: Secondary | ICD-10-CM | POA: Diagnosis not present

## 2021-08-13 ENCOUNTER — Ambulatory Visit (INDEPENDENT_AMBULATORY_CARE_PROVIDER_SITE_OTHER): Payer: BLUE CROSS/BLUE SHIELD | Admitting: *Deleted

## 2021-08-13 DIAGNOSIS — J309 Allergic rhinitis, unspecified: Secondary | ICD-10-CM | POA: Diagnosis not present

## 2021-08-20 ENCOUNTER — Ambulatory Visit (INDEPENDENT_AMBULATORY_CARE_PROVIDER_SITE_OTHER): Payer: BLUE CROSS/BLUE SHIELD

## 2021-08-20 DIAGNOSIS — J309 Allergic rhinitis, unspecified: Secondary | ICD-10-CM

## 2021-08-27 ENCOUNTER — Ambulatory Visit (INDEPENDENT_AMBULATORY_CARE_PROVIDER_SITE_OTHER): Payer: BLUE CROSS/BLUE SHIELD

## 2021-08-27 DIAGNOSIS — J309 Allergic rhinitis, unspecified: Secondary | ICD-10-CM

## 2021-09-24 ENCOUNTER — Ambulatory Visit (INDEPENDENT_AMBULATORY_CARE_PROVIDER_SITE_OTHER): Payer: BLUE CROSS/BLUE SHIELD

## 2021-09-24 DIAGNOSIS — J309 Allergic rhinitis, unspecified: Secondary | ICD-10-CM

## 2021-10-22 ENCOUNTER — Ambulatory Visit (INDEPENDENT_AMBULATORY_CARE_PROVIDER_SITE_OTHER): Payer: BLUE CROSS/BLUE SHIELD | Admitting: *Deleted

## 2021-10-22 DIAGNOSIS — J309 Allergic rhinitis, unspecified: Secondary | ICD-10-CM | POA: Diagnosis not present

## 2021-11-19 ENCOUNTER — Ambulatory Visit (INDEPENDENT_AMBULATORY_CARE_PROVIDER_SITE_OTHER): Payer: BLUE CROSS/BLUE SHIELD

## 2021-11-19 DIAGNOSIS — J309 Allergic rhinitis, unspecified: Secondary | ICD-10-CM

## 2021-12-17 ENCOUNTER — Ambulatory Visit (INDEPENDENT_AMBULATORY_CARE_PROVIDER_SITE_OTHER): Payer: BLUE CROSS/BLUE SHIELD

## 2021-12-17 DIAGNOSIS — J309 Allergic rhinitis, unspecified: Secondary | ICD-10-CM | POA: Diagnosis not present

## 2021-12-28 DIAGNOSIS — J3081 Allergic rhinitis due to animal (cat) (dog) hair and dander: Secondary | ICD-10-CM

## 2021-12-28 NOTE — Progress Notes (Signed)
VIALS EXP 12-29-22 

## 2022-02-18 ENCOUNTER — Ambulatory Visit (INDEPENDENT_AMBULATORY_CARE_PROVIDER_SITE_OTHER): Payer: BLUE CROSS/BLUE SHIELD | Admitting: *Deleted

## 2022-02-18 DIAGNOSIS — J309 Allergic rhinitis, unspecified: Secondary | ICD-10-CM | POA: Diagnosis not present

## 2022-02-25 ENCOUNTER — Ambulatory Visit (INDEPENDENT_AMBULATORY_CARE_PROVIDER_SITE_OTHER): Payer: BLUE CROSS/BLUE SHIELD

## 2022-02-25 DIAGNOSIS — J309 Allergic rhinitis, unspecified: Secondary | ICD-10-CM | POA: Diagnosis not present

## 2022-03-04 ENCOUNTER — Ambulatory Visit (INDEPENDENT_AMBULATORY_CARE_PROVIDER_SITE_OTHER): Payer: BLUE CROSS/BLUE SHIELD

## 2022-03-04 DIAGNOSIS — J309 Allergic rhinitis, unspecified: Secondary | ICD-10-CM

## 2022-03-30 ENCOUNTER — Ambulatory Visit (INDEPENDENT_AMBULATORY_CARE_PROVIDER_SITE_OTHER): Payer: BLUE CROSS/BLUE SHIELD

## 2022-03-30 DIAGNOSIS — J309 Allergic rhinitis, unspecified: Secondary | ICD-10-CM | POA: Diagnosis not present

## 2022-04-22 ENCOUNTER — Ambulatory Visit (INDEPENDENT_AMBULATORY_CARE_PROVIDER_SITE_OTHER): Payer: BLUE CROSS/BLUE SHIELD

## 2022-04-22 DIAGNOSIS — J309 Allergic rhinitis, unspecified: Secondary | ICD-10-CM

## 2022-04-29 ENCOUNTER — Ambulatory Visit (INDEPENDENT_AMBULATORY_CARE_PROVIDER_SITE_OTHER): Payer: BLUE CROSS/BLUE SHIELD

## 2022-04-29 DIAGNOSIS — J309 Allergic rhinitis, unspecified: Secondary | ICD-10-CM | POA: Diagnosis not present

## 2022-05-06 ENCOUNTER — Ambulatory Visit (INDEPENDENT_AMBULATORY_CARE_PROVIDER_SITE_OTHER): Payer: BLUE CROSS/BLUE SHIELD

## 2022-05-06 DIAGNOSIS — J309 Allergic rhinitis, unspecified: Secondary | ICD-10-CM | POA: Diagnosis not present

## 2022-06-03 ENCOUNTER — Ambulatory Visit (INDEPENDENT_AMBULATORY_CARE_PROVIDER_SITE_OTHER): Payer: BLUE CROSS/BLUE SHIELD

## 2022-06-03 DIAGNOSIS — J309 Allergic rhinitis, unspecified: Secondary | ICD-10-CM

## 2022-10-18 DIAGNOSIS — J3081 Allergic rhinitis due to animal (cat) (dog) hair and dander: Secondary | ICD-10-CM

## 2022-10-18 NOTE — Progress Notes (Signed)
Vials Exp 10/18/23

## 2023-04-27 NOTE — Progress Notes (Unsigned)
Follow Up Note  RE: Desiree Sullivan MRN: [id number] DOB: [birth year] Date of Office Visit: 04/28/2023  Referring provider: No ref. provider found Primary care provider: Patient, No Pcp Per  Chief Complaint: No chief complaint on file.  History of Present Illness: I had the pleasure of seeing Desiree Sullivan for a follow up visit at the [facility] on 04/27/2023. She is a 28 y.o. female, who is being followed for allergic rhinoconjunctivitis on AIT, asthma, food allergy, frequent upper respiratory infections. Her previous allergy office visit was on 05/15/2020 with Dr. Selena Batten via telemedicine. Today is a regular follow up visit.  Discussed the use of AI scribe software for clinical note transcription with the patient, who gave verbal consent to proceed.  History of Present Illness            You had a great response to the pneumonia vaccine. Normal immunoglobulin levels. Keep track of infections.  Assessment and Plan: Desiree Sullivan is a 28 y.o. female with: Seasonal and perennial allergic rhinoconjunctivitis Past history - patient started allergy injections in [place]. 2019 bloodwork positive to dust mites, cat, dog, grasses, cockroach, trees, and weeds.  Interim history - tolerating injections with no issues. Noticed increased sinus symptoms the last 2 days, no fevers.  Symptoms most likely due to increased tree pollen in the air and no indication for antibiotics at this time.  May use over the counter antihistamines such as Zyrtec (cetirizine), Claritin (loratadine), Allegra (fexofenadine), or Xyzal (levocetirizine) twice a day for the next few days.  Take an over the counter decongestant for the next few days as well. Restart Singulair (montelukast) 10mg  daily at night. Use Flonase 1 spray per nostril twice a day. Use a saline spray beforehand to clean out sinuses.  If symptoms worsen by Monday - send me a mychart message.  Continue weekly allergy injections. Continue  environmental control measures.  May use olopatadine eye drops 1 in each eye twice a day as needed for itchy/watery eyes.    Mild intermittent asthma, uncomplicated Stable. Daily controller medication(s): None. Prior to physical activity: May use albuterol rescue inhaler 2 puffs 5 to 15 minutes prior to strenuous physical activities. Rescue medications: May use albuterol rescue inhaler 2 puffs or nebulizer every 4 to 6 hours as needed for shortness of breath, chest tightness, coughing, and wheezing. Monitor frequency of use.    Anaphylactic shock due to adverse food reaction Past history - 2019 bloodwork mildly positive to peanuts, tree nuts. 2019 skin testing positive to shellfish and milk. Interim history - No accidental ingestion but had some symptoms after a coworker heated up peanut butter at work. Treated with benadryl and albuterol.  Continue to avoid your triggering foods.  For mild symptoms you can take over the counter antihistamines such as Benadryl and monitor symptoms closely. If symptoms worsen or if you have severe symptoms including breathing issues, throat closure, significant swelling, whole body hives, severe diarrhea and vomiting, lightheadedness then inject epinephrine and seek immediate medical care afterwards. Action plan in place.    History of frequent upper respiratory infection Past history - low pneumococcal titers. Interim history - got pneumovax but no repeat titers. Last infection was last year.  Keep track of infections. Get bloodwork for the pneumococcal titers. Assessment and Plan              No follow-ups on file.  No orders of the defined types were placed in this encounter.  Lab Orders  No  laboratory test(s) ordered today    Diagnostics: Spirometry:  Tracings reviewed. Her effort: {Blank single:19197::"Good reproducible efforts.","It was hard to get consistent efforts and there is a question as to whether this reflects a maximal  maneuver.","Poor effort, data can not be interpreted."} FVC: ***L FEV1: ***L, ***% predicted FEV1/FVC ratio: ***% Interpretation: {Blank single:19197::"Spirometry consistent with mild obstructive disease","Spirometry consistent with moderate obstructive disease","Spirometry consistent with severe obstructive disease","Spirometry consistent with possible restrictive disease","Spirometry consistent with mixed obstructive and restrictive disease","Spirometry uninterpretable due to technique","Spirometry consistent with normal pattern","No overt abnormalities noted given today's efforts"}.  Please see scanned spirometry results for details.  Skin Testing: {Blank single:19197::"Select foods","Environmental allergy panel","Environmental allergy panel and select foods","Food allergy panel","None","Deferred due to recent antihistamines use"}. *** Results discussed with patient/family.   Medication List:  Current Outpatient Medications  Medication Sig Dispense Refill   adapalene (DIFFERIN) 0.1 % cream adapalene 0.1 % topical cream  APPLY A THIN LAYER TO THE AFFFECTED AREAS EVERY OTHER DAY AS DIRECTED (Patient not taking: Reported on 05/15/2020)     albuterol (PROAIR HFA) 108 (90 Base) MCG/ACT inhaler Inhale 2 puffs into the lungs every 6 (six) hours as needed for wheezing or shortness of breath. 18 g 1   albuterol (PROVENTIL) (2.5 MG/3ML) 0.083% nebulizer solution Take 3 mLs (2.5 mg total) by nebulization every 4 (four) hours as needed for wheezing or shortness of breath. 75 mL 1   cetirizine (ZYRTEC) 10 MG tablet Take 1 tablet (10 mg total) by mouth daily. (Patient not taking: Reported on 05/15/2020) 30 tablet 5   desonide (DESOWEN) 0.05 % cream desonide 0.05 % topical cream  APPLY SPARINGLY TO THE AFFECTED AREA(S) ON FACE AND NECK 2 TIMES PER DAY FOR MAX OF 1 WEEK (Patient not taking: Reported on 05/15/2020)     EPINEPHrine (AUVI-Q) 0.3 mg/0.3 mL IJ SOAJ injection Inject 0.3 mLs (0.3 mg total) into the  muscle as needed for anaphylaxis. 1 each 0   fluticasone (FLONASE) 50 MCG/ACT nasal spray Place 1 spray into both nostrils 2 (two) times daily as needed for allergies or rhinitis. (Patient taking differently: Place 1 spray into both nostrils as needed for allergies or rhinitis.) 16 g 5   levocetirizine (XYZAL) 5 MG tablet as needed. (Patient not taking: Reported on 05/15/2020) 30 tablet 5   mometasone (ELOCON) 0.1 % cream Apply once a day on neck lesion for 10 days, then every other day for 10 days and stop it (Patient not taking: Reported on 05/15/2020)     montelukast (SINGULAIR) 10 MG tablet Take 1 tablet (10 mg total) by mouth at bedtime. 30 tablet 5   Multiple Vitamin (MULTI-VITAMIN) tablet Take by mouth.     olopatadine (PATANOL) 0.1 % ophthalmic solution Place 1 drop into both eyes 2 (two) times daily as needed for allergies. 5 mL 5   omeprazole (PRILOSEC) 40 MG capsule Take by mouth.     triamcinolone cream (KENALOG) 0.1 % Apply 1-2 times daily to dry itchy areas on body. Do not apply to face, neck, or skin folds. (Patient not taking: Reported on 05/15/2020) 30 g 3   No current facility-administered medications for this visit.   Allergies: Allergies  Allergen Reactions   Peanut-Containing Drug Products Anaphylaxis   Shellfish Allergy Anaphylaxis   Dairy Aid [Tilactase]     pain   Yeast-Derived Drug Products Itching   I reviewed her past medical history, social history, family history, and environmental history and no significant changes have been reported from her previous visit.  Review of Systems  Constitutional:  Negative for appetite change, chills, fever and unexpected weight change.  HENT:  Negative for congestion and rhinorrhea.   Eyes:  Negative for itching.  Respiratory:  Negative for cough, chest tightness, shortness of breath and wheezing.   Cardiovascular:  Negative for chest pain.  Gastrointestinal:  Negative for abdominal pain.  Genitourinary:  Negative for difficulty  urinating.  Skin:  Negative for rash.  Allergic/Immunologic: Positive for environmental allergies.  Neurological:  Negative for headaches.    Objective: There were no vitals taken for this visit. There is no height or weight on file to calculate BMI. Physical Exam Vitals and nursing note reviewed.  Constitutional:      Appearance: Normal appearance. She is well-developed.  HENT:     Head: Normocephalic and atraumatic.     Right Ear: Tympanic membrane and external ear normal.     Left Ear: Tympanic membrane and external ear normal.     Nose: Nose normal.     Mouth/Throat:     Mouth: Mucous membranes are moist.     Pharynx: Oropharynx is clear.  Eyes:     Conjunctiva/sclera: Conjunctivae normal.  Cardiovascular:     Rate and Rhythm: Normal rate and regular rhythm.     Heart sounds: Normal heart sounds. No murmur heard.    No friction rub. No gallop.  Pulmonary:     Effort: Pulmonary effort is normal.     Breath sounds: Normal breath sounds. No wheezing, rhonchi or rales.  Musculoskeletal:     Cervical back: Neck supple.  Skin:    General: Skin is warm.     Findings: No rash.  Neurological:     Mental Status: She is alert and oriented to person, place, and time.  Psychiatric:        Behavior: Behavior normal.    Previous notes and tests were reviewed. The plan was reviewed with the patient/family, and all questions/concerned were addressed.  It was my pleasure to see Kamrie today and participate in her care. Please feel free to contact me with any questions or concerns.  Sincerely,  Wyline Mood, DO Allergy & Immunology  [facility] office: [phone] [facility] office: [phone]

## 2023-04-28 ENCOUNTER — Encounter: Payer: Self-pay | Admitting: Allergy

## 2023-04-28 ENCOUNTER — Ambulatory Visit: Payer: Self-pay | Admitting: Allergy

## 2023-04-28 VITALS — BP 122/80 | HR 107 | Temp 97.7°F | Resp 16 | Ht 67.0 in | Wt 292.5 lb

## 2023-04-28 DIAGNOSIS — T7800XD Anaphylactic reaction due to unspecified food, subsequent encounter: Secondary | ICD-10-CM | POA: Diagnosis not present

## 2023-04-28 DIAGNOSIS — J4520 Mild intermittent asthma, uncomplicated: Secondary | ICD-10-CM | POA: Diagnosis not present

## 2023-04-28 DIAGNOSIS — H6991 Unspecified Eustachian tube disorder, right ear: Secondary | ICD-10-CM

## 2023-04-28 DIAGNOSIS — Z8709 Personal history of other diseases of the respiratory system: Secondary | ICD-10-CM | POA: Diagnosis not present

## 2023-04-28 DIAGNOSIS — J302 Other seasonal allergic rhinitis: Secondary | ICD-10-CM

## 2023-04-28 DIAGNOSIS — H1010 Acute atopic conjunctivitis, unspecified eye: Secondary | ICD-10-CM

## 2023-04-28 DIAGNOSIS — J3089 Other allergic rhinitis: Secondary | ICD-10-CM

## 2023-04-28 MED ORDER — EPINEPHRINE 0.3 MG/0.3ML IJ SOAJ: 0.3000 mg | INTRAMUSCULAR | 1 refills | Status: AC | PRN

## 2023-04-28 MED ORDER — MONTELUKAST SODIUM 10 MG PO TABS: 10.0000 mg | ORAL_TABLET | Freq: Every day | ORAL | 5 refills | Status: AC

## 2023-04-28 MED ORDER — ALBUTEROL SULFATE HFA 108 (90 BASE) MCG/ACT IN AERS: 2.0000 | INHALATION_SPRAY | RESPIRATORY_TRACT | 1 refills | Status: AC | PRN

## 2023-04-28 MED ORDER — ALBUTEROL SULFATE (2.5 MG/3ML) 0.083% IN NEBU: 2.5000 mg | INHALATION_SOLUTION | RESPIRATORY_TRACT | 1 refills | Status: AC | PRN

## 2023-04-28 MED ORDER — FLUTICASONE PROPIONATE 50 MCG/ACT NA SUSP: 1.0000 | Freq: Every day | NASAL | 5 refills | Status: DC | PRN

## 2023-04-28 NOTE — Patient Instructions (Addendum)
Environmental allergies 2019 bloodwork positive to dust mites, cat, dog, grasses, cockroach, trees, and weeds.  Use over the counter antihistamines such as Zyrtec (cetirizine), Claritin (loratadine), Allegra (fexofenadine), or Xyzal (levocetirizine) daily as needed. May take twice a day during allergy flares. May switch antihistamines every few months.  May use Singulair (montelukast) 10mg  daily at night. Use Flonase (fluticasone) nasal spray 1-2 sprays per nostril once a day as needed for nasal congestion.  Nasal saline spray (i.e., Simply Saline) or nasal saline lavage (i.e., NeilMed) is recommended as needed and prior to medicated nasal sprays. Consider restarting allergy injections for long term control if above medications do not help the symptoms - handout given.  Check with insurance regarding coverage.   Ear  There's slight fluid behind the right ear but it does not look infected. No need for antibiotics at this time. Take an over the counter decongestant for the next few days. Take the Flonase daily for the next week.   Mild intermittent asthma May use albuterol rescue inhaler 2 puffs or nebulizer every 4 to 6 hours as needed for shortness of breath, chest tightness, coughing, and wheezing. May use albuterol rescue inhaler 2 puffs 5 to 15 minutes prior to strenuous physical activities. Monitor frequency of use - if you need to use it more than twice per week on a consistent basis let us know.   Anaphylactic shock due to adverse food reaction Continue to avoid your triggering foods - shellfish, peanuts, tree nuts.  For mild symptoms you can take over the counter antihistamines such as Benadryl 1-2 tablets = 25-50mg  and monitor symptoms closely. If symptoms worsen or if you have severe symptoms including breathing issues, throat closure, significant swelling, whole body hives, severe diarrhea and vomiting, lightheadedness then inject epinephrine and seek immediate medical care  afterwards. Emergency action plan in place. Consider rechecking this at next visit.   History of frequent upper respiratory infection Keep track of infections and antibiotics use.  Return for updated allergy skin testing. Make sure you don't take any antihistamines for 3 days before the skin testing appointment. Don't put any lotion on the back and arms on the day of testing.  Plan on being here for 30-60 minutes.

## 2023-10-06 DIAGNOSIS — J3081 Allergic rhinitis due to animal (cat) (dog) hair and dander: Secondary | ICD-10-CM | POA: Diagnosis not present

## 2023-10-06 NOTE — Progress Notes (Signed)
 VIALS MADE 10-06-23

## 2023-10-07 DIAGNOSIS — J302 Other seasonal allergic rhinitis: Secondary | ICD-10-CM | POA: Diagnosis not present

## 2024-03-26 NOTE — Progress Notes (Unsigned)
 "  Follow Up Note  RE: Desiree Sullivan MRN: [id number] DOB: 1995-11-14 Date of Office Visit: 03/27/2024  Referring provider: No ref. provider found Primary care provider: Patient, No Pcp Per  Chief Complaint: No chief complaint on file.  History of Present Illness: I had the pleasure of seeing Desiree Sullivan for a follow up visit at the [facility] on 03/27/2024. She is a 29 y.o. female, who is being followed for allergic rhinoconjunctivitis, asthma, food allergy . Her previous allergy  office visit was on 04/28/2023 with Dr. Luke. Today is a regular follow up visit.  Discussed the use of AI scribe software for clinical note transcription with the patient, who gave verbal consent to proceed.  History of Present Illness            ***  Assessment and Plan: Desiree Sullivan is a 29 y.o. female with: Seasonal and perennial allergic rhinoconjunctivitis Past history - patient started allergy  injections in [place] . 2019 bloodwork positive to dust mites, cat, dog, grasses, cockroach, trees, and weeds.  Interim history - Last injection was in March 2024. Has new insurance. Unclear if feeling worse off AIT.  Use over the counter antihistamines such as Zyrtec  (cetirizine ), Claritin (loratadine), Allegra (fexofenadine), or Xyzal  (levocetirizine) daily as needed. May take twice a day during allergy  flares. May switch antihistamines every few months. May use Singulair  (montelukast ) 10mg  daily at night. Use Flonase  (fluticasone ) nasal spray 1-2 sprays per nostril once a day as needed for nasal congestion.  Nasal saline spray (i.e., Simply Saline) or nasal saline lavage (i.e., NeilMed) is recommended as needed and prior to medicated nasal sprays. Consider restarting allergy  injections for long term control if above medications do not help the symptoms - handout given.  Check with insurance regarding coverage.    Mild intermittent asthma, uncomplicated Flares with respiratory infections  but only requires albuterol . No recent prednisone.  Today's spirometry was unremarkable.  May use albuterol  rescue inhaler 2 puffs or nebulizer every 4 to 6 hours as needed for shortness of breath, chest tightness, coughing, and wheezing. May use albuterol  rescue inhaler 2 puffs 5 to 15 minutes prior to strenuous physical activities. Monitor frequency of use - if you need to use it more than twice per week on a consistent basis let us  know.    Anaphylactic shock due to adverse food reaction Past history - 2019 bloodwork mildly positive to peanuts, tree nuts. 2019 skin testing positive to shellfish and milk. Interim history - No accidental ingestions. Tolerates some yeast containing foods.  Continue to avoid your triggering foods - shellfish, peanuts, tree nuts.  Limit's cow's milk.  For mild symptoms you can take over the counter antihistamines such as Benadryl 1-2 tablets = 25-50mg  and monitor symptoms closely. If symptoms worsen or if you have severe symptoms including breathing issues, throat closure, significant swelling, whole body hives, severe diarrhea and vomiting, lightheadedness then inject epinephrine  and seek immediate medical care afterwards. Emergency action plan in place. Consider rechecking this at next visit.   History of frequent upper respiratory infection Past history - low pneumococcal titers with robust response to pneumovax in 2022. Interim history - 1-2 sinus infections per year but Keep track of infections.   Dysfunction of right eustachian tube There's slight fluid behind the right ear but it does not look infected. No need for antibiotics at this time. Take an over the counter decongestant for the next few days. Take the Flonase  daily for the next week. Assessment and Plan  No follow-ups on file.  No orders of the defined types were placed in this encounter.  Lab Orders  No laboratory test(s) ordered today    Diagnostics: Spirometry:   Tracings reviewed. Her effort: {Blank single:19197::Good reproducible efforts.,It was hard to get consistent efforts and there is a question as to whether this reflects a maximal maneuver.,Poor effort, data can not be interpreted.} FVC: ***L FEV1: ***L, ***% predicted FEV1/FVC ratio: ***% Interpretation: {Blank single:19197::Spirometry consistent with mild obstructive disease,Spirometry consistent with moderate obstructive disease,Spirometry consistent with severe obstructive disease,Spirometry consistent with possible restrictive disease,Spirometry consistent with mixed obstructive and restrictive disease,Spirometry uninterpretable due to technique,Spirometry consistent with normal pattern,No overt abnormalities noted given today's efforts}.  Please see scanned spirometry results for details.  Skin Testing: {Blank single:19197::Select foods,Environmental allergy  panel,Environmental allergy  panel and select foods,Food allergy  panel,None,Deferred due to recent antihistamines use}. *** Results discussed with patient/family.   Medication List:  Current Outpatient Medications  Medication Sig Dispense Refill   albuterol  (PROVENTIL ) (2.5 MG/3ML) 0.083% nebulizer solution Take 3 mLs (2.5 mg total) by nebulization every 4 (four) hours as needed for wheezing or shortness of breath (coughing fits). 75 mL 1   albuterol  (VENTOLIN  HFA) 108 (90 Base) MCG/ACT inhaler Inhale 2 puffs into the lungs every 4 (four) hours as needed for wheezing or shortness of breath (coughing fits). 18 g 1   cetirizine  (ZYRTEC ) 10 MG tablet Take 1 tablet (10 mg total) by mouth daily. 30 tablet 5   EPINEPHrine  0.3 mg/0.3 mL IJ SOAJ injection Inject 0.3 mg into the muscle as needed for anaphylaxis. 2 each 1   fluticasone  (FLONASE ) 50 MCG/ACT nasal spray Place 1-2 sprays into both nostrils daily as needed (nasal congestion). 16 g 5   ibuprofen (ADVIL) 800 MG tablet Take by mouth.      levocetirizine (XYZAL ) 5 MG tablet as needed. 30 tablet 5   montelukast  (SINGULAIR ) 10 MG tablet Take 1 tablet (10 mg total) by mouth at bedtime. 30 tablet 5   Multiple Vitamin (MULTI-VITAMIN) tablet Take by mouth.     olopatadine  (PATANOL) 0.1 % ophthalmic solution Place 1 drop into both eyes 2 (two) times daily as needed for allergies. 5 mL 5   omeprazole (PRILOSEC) 40 MG capsule Take by mouth.     No current facility-administered medications for this visit.   Allergies: Allergies[1] I reviewed her past medical history, social history, family history, and environmental history and no significant changes have been reported from her previous visit.  Review of Systems  Constitutional:  Negative for appetite change, chills, fever and unexpected weight change.  HENT:  Negative for congestion and rhinorrhea.        Right ear fullness  Eyes:  Negative for itching.  Respiratory:  Negative for cough, chest tightness, shortness of breath and wheezing.   Cardiovascular:  Negative for chest pain.  Gastrointestinal:  Negative for abdominal pain.  Genitourinary:  Negative for difficulty urinating.  Skin:  Negative for rash.  Allergic/Immunologic: Positive for environmental allergies and food allergies.  Neurological:  Negative for headaches.    Objective: There were no vitals taken for this visit. There is no height or weight on file to calculate BMI. Physical Exam Vitals and nursing note reviewed.  Constitutional:      Appearance: Normal appearance. She is well-developed.  HENT:     Head: Normocephalic and atraumatic.     Right Ear: External ear normal.     Left Ear: Tympanic membrane and external ear normal.     Ears:  Comments: Slight fluid behind right TM. No erythema or bulging.    Nose: Nose normal.     Mouth/Throat:     Mouth: Mucous membranes are moist.     Pharynx: Oropharynx is clear.  Eyes:     Conjunctiva/sclera: Conjunctivae normal.  Cardiovascular:     Rate and  Rhythm: Normal rate and regular rhythm.     Heart sounds: Normal heart sounds. No murmur heard.    No friction rub. No gallop.  Pulmonary:     Effort: Pulmonary effort is normal.     Breath sounds: Normal breath sounds. No wheezing, rhonchi or rales.  Musculoskeletal:     Cervical back: Neck supple.  Skin:    General: Skin is warm.     Findings: No rash.  Neurological:     Mental Status: She is alert and oriented to person, place, and time.  Psychiatric:        Behavior: Behavior normal.   Previous notes and tests were reviewed. The plan was reviewed with the patient/family, and all questions/concerned were addressed.  It was my pleasure to see Aldonia today and participate in her care. Please feel free to contact me with any questions or concerns.  Sincerely,  Orlan Cramp, DO Allergy  & Immunology  [facility]  [facility] office: [phone] [facility] office: [phone]     [1] Allergies Allergen Reactions   Peanut -Containing Drug Products Anaphylaxis   Shellfish Allergy  Anaphylaxis   Dairy Aid [Tilactase]     pain  "

## 2024-03-27 ENCOUNTER — Other Ambulatory Visit: Payer: Self-pay

## 2024-03-27 ENCOUNTER — Ambulatory Visit: Admitting: Allergy

## 2024-03-27 ENCOUNTER — Encounter: Payer: Self-pay | Admitting: Allergy

## 2024-03-27 VITALS — BP 116/70 | HR 99 | Temp 98.3°F | Resp 18 | Wt 284.0 lb

## 2024-03-27 DIAGNOSIS — J3089 Other allergic rhinitis: Secondary | ICD-10-CM | POA: Diagnosis not present

## 2024-03-27 DIAGNOSIS — J4520 Mild intermittent asthma, uncomplicated: Secondary | ICD-10-CM | POA: Diagnosis not present

## 2024-03-27 DIAGNOSIS — T7800XD Anaphylactic reaction due to unspecified food, subsequent encounter: Secondary | ICD-10-CM | POA: Diagnosis not present

## 2024-03-27 DIAGNOSIS — H6991 Unspecified Eustachian tube disorder, right ear: Secondary | ICD-10-CM | POA: Diagnosis not present

## 2024-03-27 DIAGNOSIS — H1010 Acute atopic conjunctivitis, unspecified eye: Secondary | ICD-10-CM | POA: Diagnosis not present

## 2024-03-27 DIAGNOSIS — Z8709 Personal history of other diseases of the respiratory system: Secondary | ICD-10-CM

## 2024-03-27 DIAGNOSIS — J302 Other seasonal allergic rhinitis: Secondary | ICD-10-CM

## 2024-03-27 MED ORDER — RYALTRIS 665-25 MCG/ACT NA SUSP: 1.0000 | Freq: Two times a day (BID) | NASAL | 5 refills | Status: AC

## 2024-03-27 NOTE — Patient Instructions (Addendum)
 Environmental allergies 2019 bloodwork positive to dust mites, cat, dog, grasses, cockroach, trees, and weeds.  Use over the counter antihistamines such as Zyrtec  (cetirizine ), Claritin (loratadine), Allegra (fexofenadine), or Xyzal  (levocetirizine) daily as needed. May take twice a day during allergy  flares. May switch antihistamines every few months.  May use Singulair  (montelukast ) 10mg  daily at night. Start Ryaltris  (olopatadine  + mometasone nasal spray combination) 1-2 sprays per nostril twice a day. Sample given. This replaces your other nasal sprays. If this works well for you, then have pharmacy ship the medication to your home - prescription already sent in.  Nasal saline spray (i.e., Simply Saline) or nasal saline lavage (i.e., NeilMed) is recommended as needed and prior to medicated nasal sprays.  Consider restarting allergy  injections for long term control if above medications do not help the symptoms. Check with insurance regarding coverage.  Consider retesting before starting shots.   Ear  There's slight fluid behind the right ear but it does not look infected. No need for antibiotics at this time. Take an over the counter decongestant for the next few days. Take Ryaltris  as above.  If you get frequent ear fullness, I may refer you to ENT next.    Mild intermittent asthma Normal breathing test today. May use albuterol  rescue inhaler 2 puffs or nebulizer every 4 to 6 hours as needed for shortness of breath, chest tightness, coughing, and wheezing. May use albuterol  rescue inhaler 2 puffs 5 to 15 minutes prior to strenuous physical activities. Monitor frequency of use - if you need to use it more than twice per week on a consistent basis let us  know.   Food allergies Continue to avoid your triggering foods - shellfish, peanuts, tree nuts.  For mild symptoms you can take over the counter antihistamines (zyrtec  10mg  to 20mg ) and monitor symptoms closely.  If symptoms worsen or  if you have severe symptoms including breathing issues, throat closure, significant swelling, whole body hives, severe diarrhea and vomiting, lightheadedness then use epinephrine  and seek immediate medical care afterwards. Emergency action plan in place. Consider rechecking this year.    History of frequent upper respiratory infection Keep track of infections and antibiotics use.  Follow up in 6 months or sooner if needed.

## 2024-10-02 ENCOUNTER — Ambulatory Visit: Admitting: Allergy
